# Patient Record
Sex: Female | Born: 1985 | Race: White | Hispanic: No | Marital: Married | Smoking: Never smoker
Health system: Southern US, Community
[De-identification: ages and names within clinical notes are randomized; demographics above are authoritative.]

## PROBLEM LIST (undated history)

## (undated) DIAGNOSIS — K219 Gastro-esophageal reflux disease without esophagitis: Secondary | ICD-10-CM

## (undated) DIAGNOSIS — T7840XA Allergy, unspecified, initial encounter: Secondary | ICD-10-CM

## (undated) DIAGNOSIS — F909 Attention-deficit hyperactivity disorder, unspecified type: Secondary | ICD-10-CM

## (undated) DIAGNOSIS — J45909 Unspecified asthma, uncomplicated: Secondary | ICD-10-CM

## (undated) DIAGNOSIS — D509 Iron deficiency anemia, unspecified: Secondary | ICD-10-CM

## (undated) HISTORY — DX: Allergy, unspecified, initial encounter: T78.40XA

## (undated) HISTORY — PX: TONSILLECTOMY: SUR1361

## (undated) HISTORY — DX: Attention-deficit hyperactivity disorder, unspecified type: F90.9

## (undated) HISTORY — PX: TUBAL LIGATION: SHX77

## (undated) HISTORY — DX: Gastro-esophageal reflux disease without esophagitis: K21.9

## (undated) HISTORY — DX: Unspecified asthma, uncomplicated: J45.909

## (undated) HISTORY — DX: Iron deficiency anemia, unspecified: D50.9

---

## 2015-08-26 DIAGNOSIS — O10019 Pre-existing essential hypertension complicating pregnancy, unspecified trimester: Secondary | ICD-10-CM | POA: Insufficient documentation

## 2015-08-26 DIAGNOSIS — O10919 Unspecified pre-existing hypertension complicating pregnancy, unspecified trimester: Secondary | ICD-10-CM | POA: Insufficient documentation

## 2015-08-26 HISTORY — DX: Pre-existing essential hypertension complicating pregnancy, unspecified trimester: O10.019

## 2015-09-29 DIAGNOSIS — O34219 Maternal care for unspecified type scar from previous cesarean delivery: Secondary | ICD-10-CM | POA: Insufficient documentation

## 2015-09-29 DIAGNOSIS — Z98891 History of uterine scar from previous surgery: Secondary | ICD-10-CM | POA: Insufficient documentation

## 2017-11-15 DIAGNOSIS — E611 Iron deficiency: Secondary | ICD-10-CM | POA: Insufficient documentation

## 2018-05-15 DIAGNOSIS — K219 Gastro-esophageal reflux disease without esophagitis: Secondary | ICD-10-CM | POA: Insufficient documentation

## 2018-07-09 DIAGNOSIS — R1013 Epigastric pain: Secondary | ICD-10-CM | POA: Insufficient documentation

## 2019-04-17 DIAGNOSIS — N912 Amenorrhea, unspecified: Secondary | ICD-10-CM | POA: Diagnosis not present

## 2019-04-28 DIAGNOSIS — N390 Urinary tract infection, site not specified: Secondary | ICD-10-CM | POA: Diagnosis not present

## 2019-04-28 DIAGNOSIS — Z113 Encounter for screening for infections with a predominantly sexual mode of transmission: Secondary | ICD-10-CM | POA: Diagnosis not present

## 2019-04-28 DIAGNOSIS — Z01419 Encounter for gynecological examination (general) (routine) without abnormal findings: Secondary | ICD-10-CM | POA: Diagnosis not present

## 2019-04-28 DIAGNOSIS — Z114 Encounter for screening for human immunodeficiency virus [HIV]: Secondary | ICD-10-CM | POA: Diagnosis not present

## 2019-04-28 DIAGNOSIS — N912 Amenorrhea, unspecified: Secondary | ICD-10-CM | POA: Diagnosis not present

## 2019-04-28 DIAGNOSIS — Z3481 Encounter for supervision of other normal pregnancy, first trimester: Secondary | ICD-10-CM | POA: Diagnosis not present

## 2019-05-09 DIAGNOSIS — Z641 Problems related to multiparity: Secondary | ICD-10-CM | POA: Diagnosis not present

## 2019-05-09 DIAGNOSIS — Z3A13 13 weeks gestation of pregnancy: Secondary | ICD-10-CM | POA: Diagnosis not present

## 2019-05-09 DIAGNOSIS — Z3682 Encounter for antenatal screening for nuchal translucency: Secondary | ICD-10-CM | POA: Diagnosis not present

## 2019-05-09 DIAGNOSIS — Z3401 Encounter for supervision of normal first pregnancy, first trimester: Secondary | ICD-10-CM | POA: Diagnosis not present

## 2019-05-09 DIAGNOSIS — Z3491 Encounter for supervision of normal pregnancy, unspecified, first trimester: Secondary | ICD-10-CM | POA: Diagnosis not present

## 2019-05-09 DIAGNOSIS — Z36 Encounter for antenatal screening for chromosomal anomalies: Secondary | ICD-10-CM | POA: Diagnosis not present

## 2019-05-09 DIAGNOSIS — Z3689 Encounter for other specified antenatal screening: Secondary | ICD-10-CM | POA: Diagnosis not present

## 2019-06-04 DIAGNOSIS — Z3A17 17 weeks gestation of pregnancy: Secondary | ICD-10-CM | POA: Diagnosis not present

## 2019-06-04 DIAGNOSIS — Z3482 Encounter for supervision of other normal pregnancy, second trimester: Secondary | ICD-10-CM | POA: Diagnosis not present

## 2019-06-30 DIAGNOSIS — Z98891 History of uterine scar from previous surgery: Secondary | ICD-10-CM | POA: Diagnosis not present

## 2019-06-30 DIAGNOSIS — O34219 Maternal care for unspecified type scar from previous cesarean delivery: Secondary | ICD-10-CM | POA: Diagnosis not present

## 2019-06-30 DIAGNOSIS — Z3A20 20 weeks gestation of pregnancy: Secondary | ICD-10-CM | POA: Diagnosis not present

## 2019-06-30 DIAGNOSIS — Z3689 Encounter for other specified antenatal screening: Secondary | ICD-10-CM | POA: Diagnosis not present

## 2019-08-13 DIAGNOSIS — Z3A27 27 weeks gestation of pregnancy: Secondary | ICD-10-CM | POA: Diagnosis not present

## 2019-08-13 DIAGNOSIS — Z3482 Encounter for supervision of other normal pregnancy, second trimester: Secondary | ICD-10-CM | POA: Diagnosis not present

## 2019-09-12 DIAGNOSIS — K219 Gastro-esophageal reflux disease without esophagitis: Secondary | ICD-10-CM | POA: Diagnosis not present

## 2019-09-22 DIAGNOSIS — O34219 Maternal care for unspecified type scar from previous cesarean delivery: Secondary | ICD-10-CM | POA: Diagnosis not present

## 2019-09-22 DIAGNOSIS — Z98891 History of uterine scar from previous surgery: Secondary | ICD-10-CM | POA: Diagnosis not present

## 2019-09-22 DIAGNOSIS — Z8759 Personal history of other complications of pregnancy, childbirth and the puerperium: Secondary | ICD-10-CM | POA: Diagnosis not present

## 2019-09-22 DIAGNOSIS — Z3A32 32 weeks gestation of pregnancy: Secondary | ICD-10-CM | POA: Diagnosis not present

## 2019-10-17 DIAGNOSIS — Z23 Encounter for immunization: Secondary | ICD-10-CM | POA: Diagnosis not present

## 2019-10-17 DIAGNOSIS — Z3A36 36 weeks gestation of pregnancy: Secondary | ICD-10-CM | POA: Diagnosis not present

## 2019-10-17 DIAGNOSIS — Z3483 Encounter for supervision of other normal pregnancy, third trimester: Secondary | ICD-10-CM | POA: Diagnosis not present

## 2019-10-20 DIAGNOSIS — Z8759 Personal history of other complications of pregnancy, childbirth and the puerperium: Secondary | ICD-10-CM | POA: Insufficient documentation

## 2019-10-31 DIAGNOSIS — Z01818 Encounter for other preprocedural examination: Secondary | ICD-10-CM | POA: Diagnosis not present

## 2019-11-04 DIAGNOSIS — Z302 Encounter for sterilization: Secondary | ICD-10-CM | POA: Diagnosis not present

## 2019-11-04 DIAGNOSIS — K219 Gastro-esophageal reflux disease without esophagitis: Secondary | ICD-10-CM | POA: Diagnosis not present

## 2019-11-04 DIAGNOSIS — O99214 Obesity complicating childbirth: Secondary | ICD-10-CM | POA: Diagnosis not present

## 2019-11-04 DIAGNOSIS — O9962 Diseases of the digestive system complicating childbirth: Secondary | ICD-10-CM | POA: Diagnosis not present

## 2019-11-04 DIAGNOSIS — O34211 Maternal care for low transverse scar from previous cesarean delivery: Secondary | ICD-10-CM | POA: Diagnosis not present

## 2019-11-04 DIAGNOSIS — O9952 Diseases of the respiratory system complicating childbirth: Secondary | ICD-10-CM | POA: Diagnosis not present

## 2019-11-04 DIAGNOSIS — R634 Abnormal weight loss: Secondary | ICD-10-CM | POA: Diagnosis not present

## 2019-11-04 DIAGNOSIS — J45909 Unspecified asthma, uncomplicated: Secondary | ICD-10-CM | POA: Diagnosis not present

## 2019-11-04 DIAGNOSIS — E663 Overweight: Secondary | ICD-10-CM | POA: Diagnosis not present

## 2019-11-04 DIAGNOSIS — R001 Bradycardia, unspecified: Secondary | ICD-10-CM | POA: Diagnosis not present

## 2019-11-04 DIAGNOSIS — O99284 Endocrine, nutritional and metabolic diseases complicating childbirth: Secondary | ICD-10-CM | POA: Diagnosis not present

## 2019-11-04 DIAGNOSIS — O34219 Maternal care for unspecified type scar from previous cesarean delivery: Secondary | ICD-10-CM | POA: Diagnosis not present

## 2019-11-04 DIAGNOSIS — E611 Iron deficiency: Secondary | ICD-10-CM | POA: Diagnosis not present

## 2019-11-04 DIAGNOSIS — Z641 Problems related to multiparity: Secondary | ICD-10-CM | POA: Diagnosis not present

## 2019-11-04 DIAGNOSIS — Z3A39 39 weeks gestation of pregnancy: Secondary | ICD-10-CM | POA: Diagnosis not present

## 2019-11-19 DIAGNOSIS — Z00111 Health examination for newborn 8 to 28 days old: Secondary | ICD-10-CM | POA: Diagnosis not present

## 2019-11-19 DIAGNOSIS — Z13228 Encounter for screening for other metabolic disorders: Secondary | ICD-10-CM | POA: Diagnosis not present

## 2020-08-06 ENCOUNTER — Ambulatory Visit (INDEPENDENT_AMBULATORY_CARE_PROVIDER_SITE_OTHER): Payer: BC Managed Care – PPO | Admitting: Family

## 2020-08-06 ENCOUNTER — Other Ambulatory Visit: Payer: Self-pay

## 2020-08-06 ENCOUNTER — Encounter: Payer: Self-pay | Admitting: Family

## 2020-08-06 VITALS — BP 132/81 | HR 85 | Ht 63.78 in | Wt 131.2 lb

## 2020-08-06 DIAGNOSIS — F902 Attention-deficit hyperactivity disorder, combined type: Secondary | ICD-10-CM | POA: Diagnosis not present

## 2020-08-06 DIAGNOSIS — J45909 Unspecified asthma, uncomplicated: Secondary | ICD-10-CM | POA: Insufficient documentation

## 2020-08-06 DIAGNOSIS — K219 Gastro-esophageal reflux disease without esophagitis: Secondary | ICD-10-CM

## 2020-08-06 DIAGNOSIS — F909 Attention-deficit hyperactivity disorder, unspecified type: Secondary | ICD-10-CM | POA: Insufficient documentation

## 2020-08-06 DIAGNOSIS — O26849 Uterine size-date discrepancy, unspecified trimester: Secondary | ICD-10-CM | POA: Insufficient documentation

## 2020-08-06 DIAGNOSIS — Z7689 Persons encountering health services in other specified circumstances: Secondary | ICD-10-CM | POA: Diagnosis not present

## 2020-08-06 NOTE — Progress Notes (Signed)
Subjective:    Jocelyn Sawyer - 35 y.o. female MRN [id number]  Date of birth: [birth year]  HPI  Jocelyn Sawyer is to establish care. Patient has a PMH significant for essential hypertension in pregnancy, asthma, gastroesophageal reflux disease without esophagitis, attention deficit hyperactivity disorder, and iron deficiency.   Current issues and/or concerns: 1. ADHD FOLLOW UP: Reports she was diagnosed at a younger age. Reports she will begin several tasks and not complete any of them most of the time. States was consistently taking Adderall until her most recent pregnancy at which time she did not take the medication. States she is ready to resume taking medication.   2. GI REFERRAL: Reports chronic history of GERD. Seeing GI in the past. Reports she had a scope procedure in the past and it was determined that she had gastritis. Was taking Protonix without relief and discontinued. Started on Dexilant which did provide relief but no longer taking currently. Says she is not eating acid reflux triggering foods. Would like referral back to GI to see if there is something more that can be done to assist and if maybe she should be restarted on Dexilant.  ROS per HPI    Health Maintenance:  Health Maintenance Due  Topic Date Due  . Hepatitis C Screening  Never done  . HIV Screening  Never done  . PAP SMEAR-Modifier  Never done  . COVID-19 Vaccine (3 - Booster for Moderna series) 02/27/2020     Past Medical History: Patient Active Problem List   Diagnosis Date Noted  . ADHD (attention deficit hyperactivity disorder) 08/06/2020  . Asthma 08/06/2020  . Uterine size date discrepancy pregnancy 08/06/2020  . [redacted] weeks gestation of pregnancy 11/04/2019  . History of prior pregnancy with IUGR newborn 10/20/2019  . Epigastric pain 07/09/2018  . Gastro-esophageal reflux disease without esophagitis 05/15/2018  . Iron deficiency 11/15/2017  . History of cesarean delivery affecting pregnancy  09/29/2015  . History of uterine scar from previous surgery 09/29/2015  . Indication for care in labor and delivery, antepartum 09/29/2015  . Essential hypertension in pregnancy 08/26/2015    Social History   reports that she has never smoked. She has never used smokeless tobacco. She reports previous alcohol use. She reports that she does not use drugs.   Family History  family history includes Cancer in her father; Hypertension in her mother.   Medications: reviewed and updated   Objective:   Physical Exam BP 132/81 (BP Location: Left Arm, Patient Position: Sitting)   Pulse 85   Ht 5' 3.78" (1.62 m)   Wt 131 lb 3.2 oz (59.5 kg)   SpO2 95%   BMI 22.68 kg/m     Physical Exam Constitutional:      Appearance: She is normal weight.  HENT:     Head: Normocephalic.  Eyes:     Extraocular Movements: Extraocular movements intact.     Pupils: Pupils are equal, round, and reactive to light.  Cardiovascular:     Rate and Rhythm: Normal rate and regular rhythm.     Pulses: Normal pulses.     Heart sounds: Normal heart sounds.  Pulmonary:     Effort: Pulmonary effort is normal.     Breath sounds: Normal breath sounds.  Musculoskeletal:     Cervical back: Normal range of motion and neck supple.  Neurological:     General: No focal deficit present.     Mental Status: She is alert and oriented to person, place, and time.  Psychiatric:  Mood and Affect: Mood normal.        Behavior: Behavior normal.       Assessment & Plan:  1. Encounter to establish care: - Patient presents today to establish care.  - Return for annual physical examination, labs, and health maintenance. Arrive fasting meaning having had no food and/or nothing to drink for at least 8 hours prior to appointment.  2. Attention deficit hyperactivity disorder (ADHD), combined type: - Reports she was diagnosed at a younger age. Reports she will begin several tasks and not complete any of them most of the  time. States was consistently taking Adderall until her most recent pregnancy at which time she did not take the medication. States she is ready to resume taking medication.  - Referral to Psychiatry for further evaluation and management.  - Ambulatory referral to Psychiatry  3. Gastro-esophageal reflux disease without esophagitis: - Reports chronic history of GERD. Seeing GI in the past. Reports she had a scope procedure in the past and it was determined that she had gastritis. Was taking Protonix without relief and discontinued. Started on Dexilant which did provide relief but no longer taking currently. Says she is not eating acid reflux triggering foods. Would like referral back to GI to see if there is something more that can be done to assist and if maybe she should be restarted on Dexilant. - Per patient request referral to Gastroenterology for further evaluation and management.  - Ambulatory referral to Gastroenterology   Ricky Stabs, NP 08/08/2020, 9:15 AM Primary Care at [facility]

## 2020-08-06 NOTE — Progress Notes (Signed)
Establish care Adderrall restart, stopped during pregnancy delivered in May 2021 Refer to GI

## 2020-08-06 NOTE — Patient Instructions (Signed)
Return for annual physical examination, labs, and health maintenance. Arrive fasting meaning having had no food and/or nothing to drink for at least 8 hours prior to appointment.  Referral to Psychiatry for ADD.   Referral to GI for acid reflux. Thank you for choosing Primary Care at [facility] for your medical home!    Jocelyn Sawyer was seen by Rema Fendt, NP today.   Jocelyn Sawyer's primary care provider is Nadalee Neiswender Jodi Geralds, NP .   For the best care possible,  you should try to see Ricky Stabs, NP whenever you come to clinic.   We look forward to seeing you again soon!  If you have any questions about your visit today,  please call us at [phone]  Or feel free to reach your provider via MyChart.    Gastroesophageal Reflux Disease, Adult  Gastroesophageal reflux (GER) happens when acid from the stomach flows up into the tube that connects the mouth and the stomach (esophagus). Normally, food travels down the esophagus and stays in the stomach to be digested. With GER, food and stomach acid sometimes move back up into the esophagus. You may have a disease called gastroesophageal reflux disease (GERD) if the reflux:  Happens often.  Causes frequent or very bad symptoms.  Causes problems such as damage to the esophagus. When this happens, the esophagus becomes sore and swollen. Over time, GERD can make small holes (ulcers) in the lining of the esophagus. What are the causes? This condition is caused by a problem with the muscle between the esophagus and the stomach. When this muscle is weak or not normal, it does not close properly to keep food and acid from coming back up from the stomach. The muscle can be weak because of:  Tobacco use.  Pregnancy.  Having a certain type of hernia (hiatal hernia).  Alcohol use.  Certain foods and drinks, such as coffee, chocolate, onions, and peppermint. What increases the risk?  Being overweight.  Having a disease  that affects your connective tissue.  Taking NSAIDs, such a ibuprofen. What are the signs or symptoms?  Heartburn.  Difficult or painful swallowing.  The feeling of having a lump in the throat.  A bitter taste in the mouth.  Bad breath.  Having a lot of saliva.  Having an upset or bloated stomach.  Burping.  Chest pain. Different conditions can cause chest pain. Make sure you see your doctor if you have chest pain.  Shortness of breath or wheezing.  A long-term cough or a cough at night.  Wearing away of the surface of teeth (tooth enamel).  Weight loss. How is this treated?  Making changes to your diet.  Taking medicine.  Having surgery. Treatment will depend on how bad your symptoms are. Follow these instructions at home: Eating and drinking  Follow a diet as told by your doctor. You may need to avoid foods and drinks such as: ? Coffee and tea, with or without caffeine. ? Drinks that contain alcohol. ? Energy drinks and sports drinks. ? Bubbly (carbonated) drinks or sodas. ? Chocolate and cocoa. ? Peppermint and mint flavorings. ? Garlic and onions. ? Horseradish. ? Spicy and acidic foods. These include peppers, chili powder, curry powder, vinegar, hot sauces, and BBQ sauce. ? Citrus fruit juices and citrus fruits, such as oranges, lemons, and limes. ? Tomato-based foods. These include red sauce, chili, salsa, and pizza with red sauce. ? Fried and fatty foods. These include donuts, french fries, potato  chips, and high-fat dressings. ? High-fat meats. These include hot dogs, rib eye steak, sausage, ham, and bacon. ? High-fat dairy items, such as whole milk, butter, and cream cheese.  Eat small meals often. Avoid eating large meals.  Avoid drinking large amounts of liquid with your meals.  Avoid eating meals during the 2-3 hours before bedtime.  Avoid lying down right after you eat.  Do not exercise right after you eat.   Lifestyle  Do not smoke or  use any products that contain nicotine or tobacco. If you need help quitting, ask your doctor.  Try to lower your stress. If you need help doing this, ask your doctor.  If you are overweight, lose an amount of weight that is healthy for you. Ask your doctor about a safe weight loss goal.   General instructions  Pay attention to any changes in your symptoms.  Take over-the-counter and prescription medicines only as told by your doctor.  Do not take aspirin, ibuprofen, or other NSAIDs unless your doctor says it is okay.  Wear loose clothes. Do not wear anything tight around your waist.  Raise (elevate) the head of your bed about 6 inches (15 cm). You may need to use a wedge to do this.  Avoid bending over if this makes your symptoms worse.  Keep all follow-up visits. Contact a doctor if:  You have new symptoms.  You lose weight and you do not know why.  You have trouble swallowing or it hurts to swallow.  You have wheezing or a cough that keeps happening.  You have a hoarse voice.  Your symptoms do not get better with treatment. Get help right away if:  You have sudden pain in your arms, neck, jaw, teeth, or back.  You suddenly feel sweaty, dizzy, or light-headed.  You have chest pain or shortness of breath.  You vomit and the vomit is green, yellow, or black, or it looks like blood or coffee grounds.  You faint.  Your poop (stool) is red, bloody, or black.  You cannot swallow, drink, or eat. These symptoms may represent a serious problem that is an emergency. Do not wait to see if the symptoms will go away. Get medical help right away. Call your local emergency services (911 in the [place] Do not drive yourself to the hospital. Summary  If a person has gastroesophageal reflux disease (GERD), food and stomach acid move back up into the esophagus and cause symptoms or problems such as damage to the esophagus.  Treatment will depend on how bad your symptoms  are.  Follow a diet as told by your doctor.  Take all medicines only as told by your doctor. This information is not intended to replace advice given to you by your health care provider. Make sure you discuss any questions you have with your health care provider. Document Revised: 12/29/2019 Document Reviewed: 12/29/2019 Elsevier Patient Education  2021 [employer].

## 2020-08-18 ENCOUNTER — Ambulatory Visit (INDEPENDENT_AMBULATORY_CARE_PROVIDER_SITE_OTHER): Payer: BC Managed Care – PPO | Admitting: Internal Medicine

## 2020-08-18 ENCOUNTER — Other Ambulatory Visit: Payer: Self-pay

## 2020-08-18 ENCOUNTER — Encounter: Payer: Self-pay | Admitting: Gastroenterology

## 2020-08-18 ENCOUNTER — Encounter: Payer: Self-pay | Admitting: Family

## 2020-08-18 VITALS — BP 124/78 | HR 68 | Ht 63.0 in | Wt 133.2 lb

## 2020-08-18 DIAGNOSIS — K219 Gastro-esophageal reflux disease without esophagitis: Secondary | ICD-10-CM | POA: Diagnosis not present

## 2020-08-18 MED ORDER — DEXLANSOPRAZOLE 60 MG PO CPDR: 60.0000 mg | DELAYED_RELEASE_CAPSULE | Freq: Every day | ORAL | 3 refills | Status: DC

## 2020-08-18 NOTE — Patient Instructions (Addendum)
We have sent the following medications to your pharmacy for you to pick up at your convenience:  Dexilant.  I will initiate a prior authorization as well.  Please follow up in one year

## 2020-08-18 NOTE — Progress Notes (Signed)
HISTORY OF PRESENT ILLNESS:  Jocelyn Sawyer is a 35 y.o. female, mother of 3 recently relocated to [place] with her husband for his job with [employer], with chronic GERD who presents today to establish care.  Also request medication refill.  Patient reports problems with classic reflux symptoms since college.  Initially treated with over-the-counter H2 receptor antagonist therapy and PPIs.  This helped.  Symptoms worsened over time.  Seen by GI physician in [facility] area November 2019.  Symptoms refractory to omeprazole and pantoprazole.  Underwent upper endoscopy (reviewed) January 2020.  Esophagus was normal.  Small lateral hernia.  Mild nonspecific antral erythema.  Otherwise normal.  She was placed on Dexilant 60 mg daily.  This results in excellent control of reflux symptoms.  Off PPI symptoms will recur within a few days to a week at which time she has severe epigastric burning, decreased appetite, and intolerance to foods.  On medication she does well with minimal food intolerance.  She denies dysphagia.  She has completed her COVID vaccination series and booster.  REVIEW OF SYSTEMS:  All non-GI ROS negative entirely  Past Medical History:  Diagnosis Date  . ADHD (attention deficit hyperactivity disorder)   . Asthma   . GERD (gastroesophageal reflux disease)   . IDA (iron deficiency anemia)     Past Surgical History:  Procedure Laterality Date  . CESAREAN SECTION     x 3  . TONSILLECTOMY    . TUBAL LIGATION      Social History Jocelyn Sawyer  reports that she has never smoked. She has never used smokeless tobacco. She reports previous alcohol use. She reports that she does not use drugs.  family history includes Bipolar disorder in her mother; Bone cancer in her paternal grandmother; Breast cancer in her maternal grandmother; Heart attack in her maternal grandfather; Hypertension in her mother; Kidney disease in her mother; Leukemia in her maternal grandmother;  Suicidality in her paternal grandfather; Throat cancer in her father.  Allergies  Allergen Reactions  . Nickel Swelling  . Sulfa Antibiotics Hives       PHYSICAL EXAMINATION: Vital signs: BP 124/78 (BP Location: Left Arm, Patient Position: Sitting, Cuff Size: Normal)   Pulse 68   Ht 5\' 3"  (1.6 m) Comment: height measured without shoes  Wt 133 lb 4 oz (60.4 kg)   LMP 08/17/2020   BMI 23.60 kg/m   Constitutional: generally well-appearing, no acute distress Psychiatric: alert and oriented x3, cooperative Eyes: extraocular movements intact, anicteric, conjunctiva pink Mouth: oral pharynx moist, no lesions Neck: supple no lymphadenopathy Cardiovascular: heart regular rate and rhythm, no murmur Lungs: clear to auscultation bilaterally Abdomen: soft, nontender, nondistended, no obvious ascites, no peritoneal signs, normal bowel sounds, no organomegaly Rectal: Omitted Extremities: no clubbing, cyanosis, or lower extremity edema bilaterally Skin: no lesions on visible extremities Neuro: No focal deficits.  Cranial nerves intact  ASSESSMENT:  1.  Chronic GERD.  Normal EGD.  Requires PPI for control symptoms.  Has failed multiple PPIs except for Dexilant   PLAN:  1.  Detailed discussion of the pathophysiology of GERD.  Discussed medical and nonmedical therapies. 2.  Prescribe Dexilant 60 mg daily.  Medication risks and the risk of of chronic PPI use reviewed in detail (based on current knowledge base). 3.  Reflux precautions 4.  Routine office follow-up in 1 year.  Contact the office in the interim for any questions or problems  Time of 45 minutes was required for.  To see the patient, reviewing  outside endoscopy reports about office notes, and tests.  Obtaining comprehensive history.  Performing medically appropriate physical examination.  Counseling the patient regarding her condition and its management.  Ordering medication and discussing its effects as discussed above.  Discussing  appropriate follow-up and its rationale.  Finally, documenting clinical information in the health record

## 2020-08-19 NOTE — Telephone Encounter (Signed)
[facility]  Is closed  because  Amy told the patient to contact them they do walk ins no need referral .   Patient will be contact    [address] [place], [place] [address] Ph # [phone] They do walk ins too

## 2020-08-24 ENCOUNTER — Telehealth: Payer: Self-pay | Admitting: Internal Medicine

## 2020-08-27 NOTE — Telephone Encounter (Signed)
Completed PA for Dexilant; awaiting response

## 2020-08-30 DIAGNOSIS — N3000 Acute cystitis without hematuria: Secondary | ICD-10-CM | POA: Diagnosis not present

## 2020-08-31 NOTE — Telephone Encounter (Signed)
Sent additional info per [facility]' request.

## 2020-09-02 ENCOUNTER — Telehealth: Payer: Self-pay | Admitting: Family

## 2020-09-02 ENCOUNTER — Other Ambulatory Visit: Payer: Self-pay

## 2020-09-02 ENCOUNTER — Ambulatory Visit: Payer: BC Managed Care – PPO | Admitting: Physician Assistant

## 2020-09-02 VITALS — BP 136/86 | HR 80 | Temp 98.2°F | Resp 18 | Ht 63.0 in | Wt 128.0 lb

## 2020-09-02 DIAGNOSIS — N3000 Acute cystitis without hematuria: Secondary | ICD-10-CM | POA: Diagnosis not present

## 2020-09-02 LAB — POCT URINALYSIS DIP (CLINITEK)
Bilirubin, UA: NEGATIVE
Blood, UA: NEGATIVE
Glucose, UA: NEGATIVE mg/dL
Ketones, POC UA: NEGATIVE mg/dL
Leukocytes, UA: NEGATIVE
Nitrite, UA: NEGATIVE
POC PROTEIN,UA: NEGATIVE
Spec Grav, UA: <=1.005 — AB (ref 1.010–1.025)
Urobilinogen, UA: 0.2 E.U./dL
pH, UA: 5.5 (ref 5.0–8.0)

## 2020-09-02 MED ORDER — CEFTRIAXONE SODIUM 1 G IJ SOLR
1.0000 g | Freq: Once | INTRAMUSCULAR | Status: AC
Start: 2020-09-02 — End: 2020-09-02
  Administered 2020-09-02: 1 g via INTRAMUSCULAR

## 2020-09-02 NOTE — Progress Notes (Signed)
Established Patient Office Visit  Subjective:  Patient ID: Jocelyn Sawyer, female    DOB: 26-Nov-1985  Age: 35 y.o. MRN: [id number]  CC:  Chief Complaint  Patient presents with  . Urinary Tract Infection    Back Pain    HPI Jocelyn Sawyer reports that she started having bilateral lower back pain, increased urgency and frequency and dysuria on Monday.  Reports chills, denies fever, nausea or vomiting.  Reports that she had a televisit through her husband's employer and was given a prescription for Freeman.  Reports that she has been taking it as directed but has not had any improvement, and feels that her symptoms have worsened.  Reports that the back pain woke her from her sleep last night.  Reports that she did use some Tylenol with some relief.    Past Medical History:  Diagnosis Date  . ADHD (attention deficit hyperactivity disorder)   . Asthma   . GERD (gastroesophageal reflux disease)   . IDA (iron deficiency anemia)     Past Surgical History:  Procedure Laterality Date  . CESAREAN SECTION     x 3  . TONSILLECTOMY    . TUBAL LIGATION      Family History  Problem Relation Age of Onset  . Hypertension Mother   . Bipolar disorder Mother   . Kidney disease Mother   . Throat cancer Father        squamous cell  . Breast cancer Maternal Grandmother   . Leukemia Maternal Grandmother   . Heart attack Maternal Grandfather   . Bone cancer Paternal Grandmother   . Suicidality Paternal Grandfather     Social History   Socioeconomic History  . Marital status: Married    Spouse name: Not on file  . Number of children: 3  . Years of education: Not on file  . Highest education level: Not on file  Occupational History  . Occupation: Nurse  Tobacco Use  . Smoking status: Never Smoker  . Smokeless tobacco: Never Used  Vaping Use  . Vaping Use: Never used  Substance and Sexual Activity  . Alcohol use: Not Currently  . Drug use: Never  . Sexual activity: Yes     Birth control/protection: None  Other Topics Concern  . Not on file  Social History Narrative  . Not on file   Social Determinants of Health   Financial Resource Strain: Not on file  Food Insecurity: Not on file  Transportation Needs: Not on file  Physical Activity: Not on file  Stress: Not on file  Social Connections: Not on file  Intimate Partner Violence: Not on file    Outpatient Medications Prior to Visit  Medication Sig Dispense Refill  . albuterol (VENTOLIN HFA) 108 (90 Base) MCG/ACT inhaler Inhale into the lungs.    Marland Kitchen amphetamine-dextroamphetamine (ADDERALL XR) 10 MG 24 hr capsule Take by mouth.    . dexlansoprazole (DEXILANT) 60 MG capsule Take 1 capsule (60 mg total) by mouth daily. 90 capsule 3  . Probiotic Product (PROBIOTIC-10 PO) Take by mouth.    . dexlansoprazole (DEXILANT) 60 MG capsule Take 60 mg by mouth daily.     No facility-administered medications prior to visit.    Allergies  Allergen Reactions  . Nickel Swelling  . Sulfa Antibiotics Hives    ROS Review of Systems  Constitutional: Positive for chills. Negative for fever.  HENT: Negative.   Eyes: Negative.   Respiratory: Negative.   Cardiovascular: Negative.   Gastrointestinal: Positive for abdominal  pain. Negative for nausea and vomiting.  Endocrine: Negative.   Genitourinary: Positive for dysuria, frequency and urgency. Negative for hematuria and vaginal discharge.  Musculoskeletal: Positive for back pain.  Skin: Negative.   Allergic/Immunologic: Negative.   Neurological: Negative.   Hematological: Negative.   Psychiatric/Behavioral: Negative.       Objective:    Physical Exam Vitals and nursing note reviewed.  Constitutional:      General: She is not in acute distress.    Appearance: Normal appearance. She is not ill-appearing.  HENT:     Head: Normocephalic and atraumatic.     Right Ear: External ear normal.     Left Ear: External ear normal.     Nose: Nose normal.      Mouth/Throat:     Mouth: Mucous membranes are moist.     Pharynx: Oropharynx is clear.  Eyes:     Extraocular Movements: Extraocular movements intact.     Conjunctiva/sclera: Conjunctivae normal.     Pupils: Pupils are equal, round, and reactive to light.  Cardiovascular:     Rate and Rhythm: Normal rate and regular rhythm.     Pulses: Normal pulses.     Heart sounds: Normal heart sounds.  Pulmonary:     Effort: Pulmonary effort is normal.     Breath sounds: Normal breath sounds.  Abdominal:     Tenderness: There is abdominal tenderness in the suprapubic area. There is right CVA tenderness and left CVA tenderness.  Musculoskeletal:        General: Normal range of motion.     Cervical back: Normal range of motion and neck supple.  Skin:    General: Skin is warm and dry.  Neurological:     General: No focal deficit present.     Mental Status: She is alert and oriented to person, place, and time.  Psychiatric:        Mood and Affect: Mood normal.        Behavior: Behavior normal.        Thought Content: Thought content normal.        Judgment: Judgment normal.     BP 136/86 (BP Location: Left Arm, Patient Position: Sitting, Cuff Size: Normal)   Pulse 80   Temp 98.2 F (36.8 C) (Oral)   Resp 18   Ht _0  (1.6 m)   Wt 128 lb (58.1 kg)   LMP 08/17/2020   SpO2 99%   BMI 22.67 kg/m  Wt Readings from Last 3 Encounters:  09/02/20 128 lb (58.1 kg)  08/18/20 133 lb 4 oz (60.4 kg)  08/06/20 131 lb 3.2 oz (59.5 kg)     Health Maintenance Due  Topic Date Due  . Hepatitis C Screening  Never done  . HIV Screening  Never done  . PAP SMEAR-Modifier  Never done    There are no preventive care reminders to display for this patient.  No results found for: TSH No results found for: WBC, HGB, HCT, MCV, PLT No results found for: NA, K, CHLORIDE, CO2, GLUCOSE, BUN, CREATININE, BILITOT, ALKPHOS, AST, ALT, PROT, ALBUMIN, CALCIUM, ANIONGAP, EGFR, GFR No results found for: CHOL No  results found for: HDL No results found for: LDLCALC No results found for: TRIG No results found for: CHOLHDL No results found for: HGBA1C    Assessment & Plan:   Problem List Items Addressed This Visit   None   Visit Diagnoses    Acute cystitis without hematuria    -  Primary  Relevant Medications   cefTRIAXone (ROCEPHIN) injection 1 g (Completed)   Other Relevant Orders   POCT URINALYSIS DIP (CLINITEK) (Completed)    1. Acute cystitis without hematuria Continue current regimen, red flags given for prompt reevaluation - POCT URINALYSIS DIP (CLINITEK) - cefTRIAXone (ROCEPHIN) injection 1 g   Meds ordered this encounter  Medications  . cefTRIAXone (ROCEPHIN) injection 1 g    I have reviewed the patient's medical history (PMH, PSH, Social History, Family History, Medications, and allergies) , and have been updated if relevant. I spent 20 minutes reviewing chart and  face to face time with patient.    Follow-up: Return if symptoms worsen or fail to improve.    Loraine Grip Mayers, PA-C

## 2020-09-02 NOTE — Patient Instructions (Signed)
Continue to finish your prescription for Macrobid.  Keep up the great work with the hydration.  I hope that you feel better soon, please let us know there is anything else that we can do for you  Roney Jaffe, PA-C Physician Assistant [facility] [email or web address]    Urinary Tract Infection, Adult  A urinary tract infection (UTI) is an infection of any part of the urinary tract. The urinary tract includes the kidneys, ureters, bladder, and urethra. These organs make, store, and get rid of urine in the body. An upper UTI affects the ureters and kidneys. A lower UTI affects the bladder and urethra. What are the causes? Most urinary tract infections are caused by bacteria in your genital area around your urethra, where urine leaves your body. These bacteria grow and cause inflammation of your urinary tract. What increases the risk? You are more likely to develop this condition if:  You have a urinary catheter that stays in place.  You are not able to control when you urinate or have a bowel movement (incontinence).  You are female and you: ? Use a spermicide or diaphragm for birth control. ? Have low estrogen levels. ? Are pregnant.  You have certain genes that increase your risk.  You are sexually active.  You take antibiotic medicines.  You have a condition that causes your flow of urine to slow down, such as: ? An enlarged prostate, if you are female. ? Blockage in your urethra. ? A kidney stone. ? A nerve condition that affects your bladder control (neurogenic bladder). ? Not getting enough to drink, or not urinating often.  You have certain medical conditions, such as: ? Diabetes. ? A weak disease-fighting system (immunesystem). ? Sickle cell disease. ? Gout. ? Spinal cord injury. What are the signs or symptoms? Symptoms of this condition include:  Needing to urinate right away (urgency).  Frequent  urination. This may include small amounts of urine each time you urinate.  Pain or burning with urination.  Blood in the urine.  Urine that smells bad or unusual.  Trouble urinating.  Cloudy urine.  Vaginal discharge, if you are female.  Pain in the abdomen or the lower back. You may also have:  Vomiting or a decreased appetite.  Confusion.  Irritability or tiredness.  A fever or chills.  Diarrhea. The first symptom in older adults may be confusion. In some cases, they may not have any symptoms until the infection has worsened. How is this diagnosed? This condition is diagnosed based on your medical history and a physical exam. You may also have other tests, including:  Urine tests.  Blood tests.  Tests for STIs (sexually transmitted infections). If you have had more than one UTI, a cystoscopy or imaging studies may be done to determine the cause of the infections. How is this treated? Treatment for this condition includes:  Antibiotic medicine.  Over-the-counter medicines to treat discomfort.  Drinking enough water to stay hydrated. If you have frequent infections or have other conditions such as a kidney stone, you may need to see a health care provider who specializes in the urinary tract (urologist). In rare cases, urinary tract infections can cause sepsis. Sepsis is a life-threatening condition that occurs when the body responds to an infection. Sepsis is treated in the hospital with IV antibiotics, fluids, and other medicines. Follow these instructions at home: Medicines  Take over-the-counter and prescription medicines only as told by your health care provider.  If you were  prescribed an antibiotic medicine, take it as told by your health care provider. Do not stop using the antibiotic even if you start to feel better. General instructions  Make sure you: ? Empty your bladder often and completely. Do not hold urine for long periods of time. ? Empty your  bladder after sex. ? Wipe from front to back after urinating or having a bowel movement if you are female. Use each tissue only one time when you wipe.  Drink enough fluid to keep your urine pale yellow.  Keep all follow-up visits. This is important.   Contact a health care provider if:  Your symptoms do not get better after 1-2 days.  Your symptoms go away and then return. Get help right away if:  You have severe pain in your back or your lower abdomen.  You have a fever or chills.  You have nausea or vomiting. Summary  A urinary tract infection (UTI) is an infection of any part of the urinary tract, which includes the kidneys, ureters, bladder, and urethra.  Most urinary tract infections are caused by bacteria in your genital area.  Treatment for this condition often includes antibiotic medicines.  If you were prescribed an antibiotic medicine, take it as told by your health care provider. Do not stop using the antibiotic even if you start to feel better.  Keep all follow-up visits. This is important. This information is not intended to replace advice given to you by your health care provider. Make sure you discuss any questions you have with your health care provider. Document Revised: 01/30/2020 Document Reviewed: 01/30/2020 Elsevier Patient Education  2021 [employer].

## 2020-09-02 NOTE — Progress Notes (Signed)
Patient reports dysuria ocuring Monday along with lower back pain. Pain began in the middle of the night on the left side and is now bilateral. Patient began UTI treatment Monday but reports back pain is still present. Patient denies any STI concerns.

## 2020-09-03 DIAGNOSIS — N3000 Acute cystitis without hematuria: Secondary | ICD-10-CM | POA: Insufficient documentation

## 2020-09-10 ENCOUNTER — Encounter: Payer: Self-pay | Admitting: Family

## 2020-09-10 NOTE — Telephone Encounter (Signed)
Dexilant PA not approved.  Sent patient mychart message asking if there was any other PPI I could try to send in for her.

## 2020-09-15 ENCOUNTER — Telehealth: Payer: Self-pay

## 2020-09-15 NOTE — Telephone Encounter (Signed)
Faxed appeal for Dexilant

## 2020-09-17 ENCOUNTER — Other Ambulatory Visit: Payer: Self-pay

## 2020-09-17 ENCOUNTER — Encounter: Payer: Self-pay | Admitting: Family

## 2020-09-17 ENCOUNTER — Ambulatory Visit (INDEPENDENT_AMBULATORY_CARE_PROVIDER_SITE_OTHER): Payer: BC Managed Care – PPO | Admitting: Family

## 2020-09-17 VITALS — BP 123/79 | HR 65 | Ht 63.78 in | Wt 128.2 lb

## 2020-09-17 DIAGNOSIS — R5383 Other fatigue: Secondary | ICD-10-CM

## 2020-09-17 DIAGNOSIS — F419 Anxiety disorder, unspecified: Secondary | ICD-10-CM

## 2020-09-17 DIAGNOSIS — Z13228 Encounter for screening for other metabolic disorders: Secondary | ICD-10-CM

## 2020-09-17 DIAGNOSIS — Z0000 Encounter for general adult medical examination without abnormal findings: Secondary | ICD-10-CM

## 2020-09-17 DIAGNOSIS — Z1329 Encounter for screening for other suspected endocrine disorder: Secondary | ICD-10-CM

## 2020-09-17 DIAGNOSIS — Z1321 Encounter for screening for nutritional disorder: Secondary | ICD-10-CM

## 2020-09-17 DIAGNOSIS — Z1159 Encounter for screening for other viral diseases: Secondary | ICD-10-CM | POA: Diagnosis not present

## 2020-09-17 DIAGNOSIS — Z13220 Encounter for screening for lipoid disorders: Secondary | ICD-10-CM

## 2020-09-17 DIAGNOSIS — Z130 Encounter for screening for diseases of the blood and blood-forming organs and certain disorders involving the immune mechanism: Secondary | ICD-10-CM | POA: Diagnosis not present

## 2020-09-17 DIAGNOSIS — R61 Generalized hyperhidrosis: Secondary | ICD-10-CM | POA: Diagnosis not present

## 2020-09-17 DIAGNOSIS — Z114 Encounter for screening for human immunodeficiency virus [HIV]: Secondary | ICD-10-CM | POA: Diagnosis not present

## 2020-09-17 DIAGNOSIS — D509 Iron deficiency anemia, unspecified: Secondary | ICD-10-CM

## 2020-09-17 DIAGNOSIS — Z124 Encounter for screening for malignant neoplasm of cervix: Secondary | ICD-10-CM

## 2020-09-17 DIAGNOSIS — F902 Attention-deficit hyperactivity disorder, combined type: Secondary | ICD-10-CM

## 2020-09-17 DIAGNOSIS — Z131 Encounter for screening for diabetes mellitus: Secondary | ICD-10-CM | POA: Diagnosis not present

## 2020-09-17 DIAGNOSIS — F32A Depression, unspecified: Secondary | ICD-10-CM

## 2020-09-17 DIAGNOSIS — L659 Nonscarring hair loss, unspecified: Secondary | ICD-10-CM

## 2020-09-17 NOTE — Patient Instructions (Addendum)
 Annual physical exam and labs today.   Follow-up with primary provider as scheduled. Preventive Care 21-36 Years Old, Female Preventive care refers to lifestyle choices and visits with your health care provider that can promote health and wellness. This includes:  A yearly physical exam. This is also called an annual wellness visit.  Regular dental and eye exams.  Immunizations.  Screening for certain conditions.  Healthy lifestyle choices, such as: ? Eating a healthy diet. ? Getting regular exercise. ? Not using drugs or products that contain nicotine and tobacco. ? Limiting alcohol use. What can I expect for my preventive care visit? Physical exam Your health care provider may check your:  Height and weight. These may be used to calculate your BMI (body mass index). BMI is a measurement that tells if you are at a healthy weight.  Heart rate and blood pressure.  Body temperature.  Skin for abnormal spots. Counseling Your health care provider may ask you questions about your:  Past medical problems.  Family's medical history.  Alcohol, tobacco, and drug use.  Emotional well-being.  Home life and relationship well-being.  Sexual activity.  Diet, exercise, and sleep habits.  Work and work environment.  Access to firearms.  Method of birth control.  Menstrual cycle.  Pregnancy history. What immunizations do I need? Vaccines are usually given at various ages, according to a schedule. Your health care provider will recommend vaccines for you based on your age, medical history, and lifestyle or other factors, such as travel or where you work.   What tests do I need? Blood tests  Lipid and cholesterol levels. These may be checked every 5 years starting at age 20.  Hepatitis C test.  Hepatitis B test. Screening  Diabetes screening. This is done by checking your blood sugar (glucose) after you have not eaten for a while (fasting).  STD (sexually  transmitted disease) testing, if you are at risk.  BRCA-related cancer screening. This may be done if you have a family history of breast, ovarian, tubal, or peritoneal cancers.  Pelvic exam and Pap test. This may be done every 3 years starting at age 21. Starting at age 30, this may be done every 5 years if you have a Pap test in combination with an HPV test. Talk with your health care provider about your test results, treatment options, and if necessary, the need for more tests.   Follow these instructions at home: Eating and drinking  Eat a healthy diet that includes fresh fruits and vegetables, whole grains, lean protein, and low-fat dairy products.  Take vitamin and mineral supplements as recommended by your health care provider.  Do not drink alcohol if: ? Your health care provider tells you not to drink. ? You are pregnant, may be pregnant, or are planning to become pregnant.  If you drink alcohol: ? Limit how much you have to 0-1 drink a day. ? Be aware of how much alcohol is in your drink. In the [place] one drink equals one 12 oz bottle of beer (355 mL), one 5 oz glass of wine (148 mL), or one 1 oz glass of hard liquor (44 mL).   Lifestyle  Take daily care of your teeth and gums. Brush your teeth every morning and night with fluoride toothpaste. Floss one time each day.  Stay active. Exercise for at least 30 minutes 5 or more days each week.  Do not use any products that contain nicotine or tobacco, such as cigarettes, e-cigarettes, and   chewing tobacco. If you need help quitting, ask your health care provider.  Do not use drugs.  If you are sexually active, practice safe sex. Use a condom or other form of protection to prevent STIs (sexually transmitted infections).  If you do not wish to become pregnant, use a form of birth control. If you plan to become pregnant, see your health care provider for a prepregnancy visit.  Find healthy ways to cope with stress, such  as: ? Meditation, yoga, or listening to music. ? Journaling. ? Talking to a trusted person. ? Spending time with friends and family. Safety  Always wear your seat belt while driving or riding in a vehicle.  Do not drive: ? If you have been drinking alcohol. Do not ride with someone who has been drinking. ? When you are tired or distracted. ? While texting.  Wear a helmet and other protective equipment during sports activities.  If you have firearms in your house, make sure you follow all gun safety procedures.  Seek help if you have been physically or sexually abused. What's next?  Go to your health care provider once a year for an annual wellness visit.  Ask your health care provider how often you should have your eyes and teeth checked.  Stay up to date on all vaccines. This information is not intended to replace advice given to you by your health care provider. Make sure you discuss any questions you have with your health care provider. Document Revised: 02/15/2020 Document Reviewed: 02/28/2018 Elsevier Patient Education  2021 Elsevier Inc.  

## 2020-09-17 NOTE — Telephone Encounter (Signed)
PA appeal for Dexilant approved.  Let patient know through mychart.

## 2020-09-17 NOTE — Progress Notes (Signed)
Physical

## 2020-09-17 NOTE — Progress Notes (Signed)
Patient ID: Jocelyn Sawyer, female    DOB: [birth year]  MRN: [id number]  CC: Annual Physical Exam  Subjective: Jocelyn Sawyer is a 35 y.o. female who presents for annual physical exam.  Her concerns today include:   Reports feeling tired, fatigue, night sweats, hair loss, and feeling sad. Denies thoughts of self-harm, suicidal ideations, and homicidal ideations. She does have 4 children. The youngest child being 28 months old. She considered if she was experiencing post-partum depression several months ago. She then ruled that out because the symptoms were persisting and she has no previous history of postpartum depression. She is scheduled appointment with Psychiatry soon.  Depression screen [facility] 2/9 09/17/2020 09/02/2020 08/06/2020  Decreased Interest 1 1 0  Down, Depressed, Hopeless 2 0 0  PHQ - 2 Score 3 1 0  Altered sleeping 0 0 -  Tired, decreased energy 2 2 -  Change in appetite 0 0 -  Feeling bad or failure about yourself  2 0 -  Trouble concentrating 2 2 -  Moving slowly or fidgety/restless 0 0 -  Suicidal thoughts 0 0 -  PHQ-9 Score 9 5 -  Difficult doing work/chores Very difficult - -    Patient Active Problem List   Diagnosis Date Noted  . Iron deficiency anemia 09/18/2020  . Acute cystitis without hematuria 09/03/2020  . ADHD (attention deficit hyperactivity disorder) 08/06/2020  . Asthma 08/06/2020  . Uterine size date discrepancy pregnancy 08/06/2020  . [redacted] weeks gestation of pregnancy 11/04/2019  . History of prior pregnancy with IUGR newborn 10/20/2019  . Epigastric pain 07/09/2018  . Gastro-esophageal reflux disease without esophagitis 05/15/2018  . Iron deficiency 11/15/2017  . History of cesarean delivery affecting pregnancy 09/29/2015  . History of uterine scar from previous surgery 09/29/2015  . Indication for care in labor and delivery, antepartum 09/29/2015  . Essential hypertension in pregnancy 08/26/2015     Current Outpatient Medications on File  Prior to Visit  Medication Sig Dispense Refill  . albuterol (VENTOLIN HFA) 108 (90 Base) MCG/ACT inhaler Inhale into the lungs.    Marland Kitchen amphetamine-dextroamphetamine (ADDERALL XR) 10 MG 24 hr capsule Take by mouth.    . dexlansoprazole (DEXILANT) 60 MG capsule Take 1 capsule (60 mg total) by mouth daily. 90 capsule 3  . Probiotic Product (PROBIOTIC-10 PO) Take by mouth.     No current facility-administered medications on file prior to visit.    Allergies  Allergen Reactions  . Nickel Swelling  . Sulfa Antibiotics Hives    Social History   Socioeconomic History  . Marital status: Married    Spouse name: Not on file  . Number of children: 3  . Years of education: Not on file  . Highest education level: Not on file  Occupational History  . Occupation: Nurse  Tobacco Use  . Smoking status: Never Smoker  . Smokeless tobacco: Never Used  Vaping Use  . Vaping Use: Never used  Substance and Sexual Activity  . Alcohol use: Not Currently  . Drug use: Never  . Sexual activity: Yes    Birth control/protection: None  Other Topics Concern  . Not on file  Social History Narrative  . Not on file   Social Determinants of Health   Financial Resource Strain: Not on file  Food Insecurity: Not on file  Transportation Needs: Not on file  Physical Activity: Not on file  Stress: Not on file  Social Connections: Not on file  Intimate Partner Violence: Not on file  Family History  Problem Relation Age of Onset  . Hypertension Mother   . Bipolar disorder Mother   . Kidney disease Mother   . Throat cancer Father        squamous cell  . Breast cancer Maternal Grandmother   . Leukemia Maternal Grandmother   . Heart attack Maternal Grandfather   . Bone cancer Paternal Grandmother   . Suicidality Paternal Grandfather     Past Surgical History:  Procedure Laterality Date  . CESAREAN SECTION     x 3  . TONSILLECTOMY    . TUBAL LIGATION      ROS: Review of Systems Negative  except as stated above  PHYSICAL EXAM: BP 123/79 (BP Location: Left Arm, Patient Position: Sitting)   Pulse 65   Ht 5' 3.78" (1.62 m)   Wt 128 lb 3.2 oz (58.2 kg)   SpO2 98%   BMI 22.16 kg/m    Wt Readings from Last 3 Encounters:  09/17/20 128 lb 3.2 oz (58.2 kg)  09/02/20 128 lb (58.1 kg)  08/18/20 133 lb 4 oz (60.4 kg)    Physical Exam Exam conducted with a chaperone present.  Constitutional:      Appearance: She is normal weight.  HENT:     Head: Normocephalic and atraumatic.     Right Ear: Tympanic membrane, ear canal and external ear normal.     Left Ear: Tympanic membrane, ear canal and external ear normal.     Nose: Nose normal.     Mouth/Throat:     Mouth: Mucous membranes are moist.     Pharynx: Oropharynx is clear.  Eyes:     Extraocular Movements: Extraocular movements intact.     Conjunctiva/sclera: Conjunctivae normal.     Pupils: Pupils are equal, round, and reactive to light.  Cardiovascular:     Rate and Rhythm: Normal rate and regular rhythm.     Pulses: Normal pulses.     Heart sounds: Normal heart sounds.  Pulmonary:     Effort: Pulmonary effort is normal.     Breath sounds: Normal breath sounds.  Chest:  Breasts:     Right: Normal.     Left: Normal.      Comments: Margorie John, CMA present during examination.  Abdominal:     General: Abdomen is flat. Bowel sounds are normal.     Palpations: Abdomen is soft.  Genitourinary:    Comments: Patient declined examination. Musculoskeletal:        General: Normal range of motion.     Cervical back: Normal range of motion and neck supple.  Skin:    General: Skin is warm and dry.     Capillary Refill: Capillary refill takes less than 2 seconds.  Neurological:     General: No focal deficit present.     Mental Status: She is alert and oriented to person, place, and time.  Psychiatric:        Mood and Affect: Mood normal.        Behavior: Behavior normal.    ASSESSMENT AND PLAN: 1. Annual  physical exam: - Counseled on 150 minutes of exercise per week as tolerated, healthy eating (including decreased daily intake of saturated fats, cholesterol, added sugars, sodium), STI prevention, and routine healthcare maintenance.  2. Screening for metabolic disorder: - CMP to check kidney function, liver function, and electrolyte balance.  - Comprehensive metabolic panel  3. Screening for deficiency anemia: - CBC to screen for anemia. - Iron panel to screen for  anemia. - CBC - Iron, TIBC and Ferritin Panel  4. Diabetes mellitus screening: - Hemoglobin A1c to screen for pre-diabetes/diabetes. - Hemoglobin A1c  5. Screening cholesterol level: - Lipid panel to screen for high cholesterol.  - Lipid panel  6. Thyroid disorder screen: - TSH to check thyroid function.  - TSH+T4F+T3Free  7. Need for hepatitis C screening test: - HCV antibody to screen for hepatitis C.  - HCV Ab w/Rflx to Verification  8. Encounter for screening for HIV: - HIV antibody to screen for human immunodeficiency virus.  - HIV antibody (with reflex)  9. Cervical cancer screening: - Referral to Gynecology for cervical cancer screening by PAP smear. - Ambulatory referral to Gynecology  10. Encounter for vitamin deficiency screening: - Vitamin B12 and vitamin D to screen for vitamin deficiencies. - Vitamin B12 - Vitamin D, 25-hydroxy  11. Night sweats: 12. Fatigue, unspecified type: 13. Hair loss: - FSH to screen hormone levels. - TSH, CBC, CMP, hemoglobin A1c, vitamin D deficiency screen, and vitamin B12 to screen for fatigue.  - Follow-up with primary provider as needed for referral to Dermatology for hair loss.  - Follicle stimulating hormone  14. Attention deficit hyperactivity disorder (ADHD), combined type: - Keep appointment scheduled with Psychiatry.  15. Anxiety and depression: - Today patient's PHQ 2/9 screening consistent with anxiety and depression. - Stable.  - Denies thoughts of  self-harm, suicidal ideations, and homicidal ideations.  - Patient encouraged to discuss at appointment with Psychiatry.  - Follow-up with primary provider as needed.    Orders Placed This Encounter  Procedures  . HCV Ab w/Rflx to Verification  . HIV antibody (with reflex)  . CBC  . Comprehensive metabolic panel  . Lipid panel  . TSH+T4F+T3Free  . Hemoglobin A1c  . Follicle stimulating hormone  . Vitamin B12  . Vitamin D, 25-hydroxy  . Iron, TIBC and Ferritin Panel  . Interpretation:  . Ambulatory referral to Gynecology     Requested Prescriptions   Signed Prescriptions Disp Refills  . ferumoxytol in sodium chloride 0.9 % 100 mL 1 Dose 0    Sig: Ordered dose: 510 mg Route: intravenous Frequency: once @ 468 mL/hr over 15 minutes Dispense: 1 each Admin instructions: Monitor patients carefully for allergic reactions during Feraheme administration and for at least 30 minutes following each infusion. Allow minimum of 30 minutes between Feraheme and admin of meds that could potentially cause hypersensitivity reactions, such as chemotherapeutic agents or monoclonal antibodies. Note: After 3 to 8 days administer second dose.  . ferumoxytol in sodium chloride 0.9 % 100 mL 1 Dose 0    Sig: Ordered dose: 510 mg Route: intravenous Frequency: once @ 468 mL/hr over 15 minutes Dispense: 1 each Admin instructions: Monitor patients carefully for allergic reactions during Feraheme administration and for at least 30 minutes following each infusion. Allow minimum of 30 minutes between Feraheme and admin of meds that could potentially cause hypersensitivity reactions, such as chemotherapeutic agents or monoclonal antibodies. Note: After 3 to 8 days administer second dose.    Follow-up with primary provider as scheduled.   Rema Fendt, NP

## 2020-09-18 DIAGNOSIS — D509 Iron deficiency anemia, unspecified: Secondary | ICD-10-CM | POA: Insufficient documentation

## 2020-09-18 LAB — IRON,TIBC AND FERRITIN PANEL
Ferritin: 10 ng/mL — ABNORMAL LOW (ref 15–150)
Iron Saturation: 8 % — CL (ref 15–55)
Iron: 31 ug/dL (ref 27–159)
Total Iron Binding Capacity: 395 ug/dL (ref 250–450)
UIBC: 364 ug/dL (ref 131–425)

## 2020-09-18 LAB — COMPREHENSIVE METABOLIC PANEL
ALT: 14 IU/L (ref 0–32)
AST: 21 IU/L (ref 0–40)
Albumin/Globulin Ratio: 1.5 (ref 1.2–2.2)
Albumin: 4.4 g/dL (ref 3.8–4.8)
Alkaline Phosphatase: 49 IU/L (ref 44–121)
BUN/Creatinine Ratio: 20 (ref 9–23)
BUN: 21 mg/dL — ABNORMAL HIGH (ref 6–20)
Bilirubin Total: <0.2 mg/dL (ref 0.0–1.2)
CO2: 21 mmol/L (ref 20–29)
Calcium: 9.4 mg/dL (ref 8.7–10.2)
Chloride: 102 mmol/L (ref 96–106)
Creatinine, Ser: 1.05 mg/dL — ABNORMAL HIGH (ref 0.57–1.00)
Globulin, Total: 2.9 g/dL (ref 1.5–4.5)
Glucose: 87 mg/dL (ref 65–99)
Potassium: 4.1 mmol/L (ref 3.5–5.2)
Sodium: 138 mmol/L (ref 134–144)
Total Protein: 7.3 g/dL (ref 6.0–8.5)
eGFR: 71 mL/min/{1.73_m2} (ref >59)

## 2020-09-18 LAB — CBC
Hematocrit: 39.2 % (ref 34.0–46.6)
Hemoglobin: 12.5 g/dL (ref 11.1–15.9)
MCH: 25.9 pg — ABNORMAL LOW (ref 26.6–33.0)
MCHC: 31.9 g/dL (ref 31.5–35.7)
MCV: 81 fL (ref 79–97)
Platelets: 319 10*3/uL (ref 150–450)
RBC: 4.83 x10E6/uL (ref 3.77–5.28)
RDW: 14 % (ref 11.7–15.4)
WBC: 7.2 10*3/uL (ref 3.4–10.8)

## 2020-09-18 LAB — LIPID PANEL
Chol/HDL Ratio: 3.2 ratio (ref 0.0–4.4)
Cholesterol, Total: 178 mg/dL (ref 100–199)
HDL: 56 mg/dL (ref >39)
LDL Chol Calc (NIH): 105 mg/dL — ABNORMAL HIGH (ref 0–99)
Triglycerides: 96 mg/dL (ref 0–149)
VLDL Cholesterol Cal: 17 mg/dL (ref 5–40)

## 2020-09-18 LAB — VITAMIN D 25 HYDROXY (VIT D DEFICIENCY, FRACTURES): Vit D, 25-Hydroxy: 60.2 ng/mL (ref 30.0–100.0)

## 2020-09-18 LAB — VITAMIN B12: Vitamin B-12: 633 pg/mL (ref 232–1245)

## 2020-09-18 LAB — HCV INTERPRETATION

## 2020-09-18 LAB — TSH+T4F+T3FREE
Free T4: 1.28 ng/dL (ref 0.82–1.77)
T3, Free: 2.7 pg/mL (ref 2.0–4.4)
TSH: 1.8 u[IU]/mL (ref 0.450–4.500)

## 2020-09-18 LAB — FOLLICLE STIMULATING HORMONE: FSH: 4.6 m[IU]/mL

## 2020-09-18 LAB — HCV AB W/RFLX TO VERIFICATION: HCV Ab: <0.1 s/co ratio (ref 0.0–0.9)

## 2020-09-18 LAB — HIV ANTIBODY (ROUTINE TESTING W REFLEX): HIV Screen 4th Generation wRfx: NONREACTIVE

## 2020-09-18 LAB — HEMOGLOBIN A1C
Est. average glucose Bld gHb Est-mCnc: 120 mg/dL
Hgb A1c MFr Bld: 5.8 % — ABNORMAL HIGH (ref 4.8–5.6)

## 2020-09-18 MED ORDER — SODIUM CHLORIDE 0.9 % IV SOLN: INTRAVENOUS | 0 refills | Status: DC

## 2020-09-18 NOTE — Progress Notes (Signed)
Also, please plan to schedule an appointment to have your iron levels recheck at least 1 month after your second iron infusion.

## 2020-09-18 NOTE — Progress Notes (Signed)
Kidney function normal.   Liver function normal.   Thyroid normal.   Hormone  level normal.   Vitamin D normal.   Vitamin B12 normal.  Hepatitis C negative.  HIV negative.  LDL cholesterol, sometimes called "bad cholesterol", higher than expected. This may increase risk of heart attack and/or stroke. Consider eating more fruits, vegetables, and lean baked meats such as chicken or fish. Moderate intensity exercise at least 150 minutes as tolerated per week may help as well. Patient encouraged to schedule appointment to have cholesterol rechecked in 6 months or sooner if needed.   Your hemoglobin A1c is consistent with pre-diabetes. Practice healthy eating habits of fresh fruit and vegetables, lean baked meats such as chicken, fish, and [place]; limit breads, rice, pastas, and desserts; practice regular aerobic exercise (at least 150 minutes a week as tolerated). Patient encouraged to schedule appointment to have prediabetes rechecked in 6 months or sooner if needed.   Your iron is low. Two iron infusions have been ordered for this. The nurse will call you to schedule appointments for iron infusions. Also, consider trying to increase iron-rich foods in your diet such as dark green leafy vegetables and dried fruit such as raisins and apricots.

## 2020-09-19 DIAGNOSIS — F419 Anxiety disorder, unspecified: Secondary | ICD-10-CM | POA: Insufficient documentation

## 2020-09-19 DIAGNOSIS — F32A Depression, unspecified: Secondary | ICD-10-CM | POA: Insufficient documentation

## 2020-09-21 ENCOUNTER — Encounter: Payer: Self-pay | Admitting: Family

## 2020-09-21 ENCOUNTER — Telehealth: Payer: Self-pay

## 2020-09-21 ENCOUNTER — Telehealth: Payer: Self-pay | Admitting: Family

## 2020-09-21 NOTE — Telephone Encounter (Signed)
Spoke w/pt to advise that iron infusions had been scheduled. Pt stated that she called [facility] to see if there were any other days due to childs bday, they advised it would be further out. Pt stated she would try to make appt

## 2020-09-21 NOTE — Telephone Encounter (Signed)
Pt called requesting PCP send her to a different place for iron infusion.   Pt wants to have it before 3/29 if possible   Pt says she called and there are no other appts available at the [facility] she is scheduled at.

## 2020-09-24 ENCOUNTER — Other Ambulatory Visit: Payer: Self-pay

## 2020-09-24 DIAGNOSIS — D509 Iron deficiency anemia, unspecified: Secondary | ICD-10-CM

## 2020-09-24 MED ORDER — SODIUM CHLORIDE 0.9 % IV SOLN: INTRAVENOUS | 0 refills | Status: DC

## 2020-09-28 ENCOUNTER — Ambulatory Visit (HOSPITAL_COMMUNITY)
Admission: RE | Admit: 2020-09-28 | Discharge: 2020-09-28 | Disposition: A | Discharge status: Home / Self Care | Payer: BC Managed Care – PPO | Source: Ambulatory Visit | Attending: Internal Medicine | Admitting: Internal Medicine

## 2020-09-28 ENCOUNTER — Other Ambulatory Visit: Payer: Self-pay

## 2020-09-28 DIAGNOSIS — D509 Iron deficiency anemia, unspecified: Secondary | ICD-10-CM | POA: Insufficient documentation

## 2020-09-28 MED ORDER — SODIUM CHLORIDE 0.9 % IV SOLN
510.0000 mg | Freq: Once | INTRAVENOUS | Status: AC
  Administered 2020-09-28: 510 mg via INTRAVENOUS
  Filled 2020-09-28: qty 510

## 2020-09-28 MED ORDER — SODIUM CHLORIDE 0.9 % IV SOLN
INTRAVENOUS | Status: DC | PRN
  Administered 2020-09-28: 250 mL via INTRAVENOUS

## 2020-09-28 NOTE — Discharge Instructions (Signed)
Ferumoxytol injection What is this medicine? FERUMOXYTOL is an iron complex. Iron is used to make healthy red blood cells, which carry oxygen and nutrients throughout the body. This medicine is used to treat iron deficiency anemia. This medicine may be used for other purposes; ask your health care provider or pharmacist if you have questions. COMMON BRAND NAME(S): Feraheme What should I tell my health care provider before I take this medicine? They need to know if you have any of these conditions:  anemia not caused by low iron levels  high levels of iron in the blood  magnetic resonance imaging (MRI) test scheduled  an unusual or allergic reaction to iron, other medicines, foods, dyes, or preservatives  pregnant or trying to get pregnant  breast-feeding How should I use this medicine? This medicine is for injection into a vein. It is given by a health care professional in a hospital or clinic setting. Talk to your pediatrician regarding the use of this medicine in children. Special care may be needed. Overdosage: If you think you have taken too much of this medicine contact a poison control center or emergency room at once. NOTE: This medicine is only for you. Do not share this medicine with others. What if I miss a dose? It is important not to miss your dose. Call your doctor or health care professional if you are unable to keep an appointment. What may interact with this medicine? This medicine may interact with the following medications:  other iron products This list may not describe all possible interactions. Give your health care provider a list of all the medicines, herbs, non-prescription drugs, or dietary supplements you use. Also tell them if you smoke, drink alcohol, or use illegal drugs. Some items may interact with your medicine. What should I watch for while using this medicine? Visit your doctor or healthcare professional regularly. Tell your doctor or healthcare  professional if your symptoms do not start to get better or if they get worse. You may need blood work done while you are taking this medicine. You may need to follow a special diet. Talk to your doctor. Foods that contain iron include: whole grains/cereals, dried fruits, beans, or peas, leafy green vegetables, and organ meats (liver, kidney). What side effects may I notice from receiving this medicine? Side effects that you should report to your doctor or health care professional as soon as possible:  allergic reactions like skin rash, itching or hives, swelling of the face, lips, or tongue  breathing problems  changes in blood pressure  feeling faint or lightheaded, falls  fever or chills  flushing, sweating, or hot feelings  swelling of the ankles or feet Side effects that usually do not require medical attention (report to your doctor or health care professional if they continue or are bothersome):  diarrhea  headache  nausea, vomiting  stomach pain This list may not describe all possible side effects. Call your doctor for medical advice about side effects. You may report side effects to FDA at 1-800-FDA-1088. Where should I keep my medicine? This drug is given in a hospital or clinic and will not be stored at home. NOTE: This sheet is a summary. It may not cover all possible information. If you have questions about this medicine, talk to your doctor, pharmacist, or health care provider.  2021 Elsevier/Gold Standard (2016-08-07 20:21:10)  

## 2020-09-28 NOTE — Progress Notes (Signed)
Patient received IV  Feraheme (Dose 1 of 2) as ordered by Delfin Gant NP. Observed for at least 30 minutes post infusion. Tolerated well, vitals stable, discharge instructions given , verbalized understanding. Pt states she is already scheduled for her second infusion. Patient alert, oriented, and ambulatory at the time of discharge.

## 2020-10-04 ENCOUNTER — Encounter (HOSPITAL_COMMUNITY): Payer: BC Managed Care – PPO

## 2020-10-05 DIAGNOSIS — F900 Attention-deficit hyperactivity disorder, predominantly inattentive type: Secondary | ICD-10-CM | POA: Diagnosis not present

## 2020-10-08 ENCOUNTER — Non-Acute Institutional Stay (HOSPITAL_COMMUNITY)
Admission: RE | Admit: 2020-10-08 | Discharge: 2020-10-08 | Disposition: A | Discharge status: Home / Self Care | Payer: BC Managed Care – PPO | Source: Ambulatory Visit | Attending: Internal Medicine | Admitting: Internal Medicine

## 2020-10-08 ENCOUNTER — Other Ambulatory Visit: Payer: Self-pay

## 2020-10-08 DIAGNOSIS — D509 Iron deficiency anemia, unspecified: Secondary | ICD-10-CM | POA: Insufficient documentation

## 2020-10-08 MED ORDER — SODIUM CHLORIDE 0.9 % IV SOLN
510.0000 mg | Freq: Once | INTRAVENOUS | Status: AC
  Administered 2020-10-08: 510 mg via INTRAVENOUS
  Filled 2020-10-08: qty 510

## 2020-10-08 MED ORDER — SODIUM CHLORIDE 0.9 % IV SOLN
INTRAVENOUS | Status: DC | PRN
  Administered 2020-10-08: 250 mL via INTRAVENOUS

## 2020-10-08 NOTE — Progress Notes (Signed)
Patient received IV Feraheme as ordered by Amy Stephens NP. Observed for at least 30 minutes post infusion.Tolerated well, vitals stable, discharge instructions given, verbalized understanding. Patient alert, oriented and ambulatory at the time of discharge.  

## 2020-10-28 DIAGNOSIS — F900 Attention-deficit hyperactivity disorder, predominantly inattentive type: Secondary | ICD-10-CM | POA: Diagnosis not present

## 2020-11-23 ENCOUNTER — Telehealth: Payer: Self-pay | Admitting: Family

## 2020-11-23 NOTE — Telephone Encounter (Signed)
Pt states she is still feeling fatigued even after the Iron infusions. Pt wants to know what else she needs to do. Please advise and thank you

## 2020-11-23 NOTE — Telephone Encounter (Signed)
Patient should have repeat CBC at least 4 weeks after second iron infusion. If this applies to the patient please schedule lab only appointment.

## 2020-11-24 NOTE — Telephone Encounter (Signed)
Att to contact pt to advise that lab appt needed for CBC, no ans lvm

## 2020-11-25 ENCOUNTER — Other Ambulatory Visit: Payer: Self-pay

## 2020-11-25 ENCOUNTER — Other Ambulatory Visit: Payer: BC Managed Care – PPO

## 2020-11-25 DIAGNOSIS — D509 Iron deficiency anemia, unspecified: Secondary | ICD-10-CM

## 2020-11-25 NOTE — Progress Notes (Signed)
CBC ordered 

## 2020-11-26 ENCOUNTER — Other Ambulatory Visit: Payer: Self-pay | Admitting: Family

## 2020-11-26 ENCOUNTER — Encounter: Payer: Self-pay | Admitting: Family

## 2020-11-26 DIAGNOSIS — R5382 Chronic fatigue, unspecified: Secondary | ICD-10-CM

## 2020-11-26 DIAGNOSIS — D509 Iron deficiency anemia, unspecified: Secondary | ICD-10-CM

## 2020-11-26 LAB — CBC
Hematocrit: 42.1 % (ref 34.0–46.6)
Hemoglobin: 14.2 g/dL (ref 11.1–15.9)
MCH: 28.9 pg (ref 26.6–33.0)
MCHC: 33.7 g/dL (ref 31.5–35.7)
MCV: 86 fL (ref 79–97)
Platelets: 266 10*3/uL (ref 150–450)
RBC: 4.92 x10E6/uL (ref 3.77–5.28)
RDW: 15.6 % — ABNORMAL HIGH (ref 11.7–15.4)
WBC: 5.5 10*3/uL (ref 3.4–10.8)

## 2020-11-26 NOTE — Progress Notes (Signed)
Anemia back to baseline since iron infusions.   In regards to continued fatigue sometimes anxiety and depression may cause this. Keep appointments with Psychiatry for further evaluation and management of this possibility.   Also, referral placed to Rheumatology for further evaluation of chronic fatigue. Their office should call patient within 2 weeks with appointment details.

## 2020-11-29 DIAGNOSIS — N3000 Acute cystitis without hematuria: Secondary | ICD-10-CM | POA: Diagnosis not present

## 2020-11-29 DIAGNOSIS — N390 Urinary tract infection, site not specified: Secondary | ICD-10-CM | POA: Diagnosis not present

## 2020-12-02 ENCOUNTER — Ambulatory Visit: Payer: BC Managed Care – PPO | Attending: Physician Assistant

## 2020-12-02 ENCOUNTER — Other Ambulatory Visit: Payer: Self-pay

## 2020-12-02 ENCOUNTER — Ambulatory Visit: Payer: BC Managed Care – PPO | Admitting: Physician Assistant

## 2020-12-02 VITALS — BP 127/80 | HR 72 | Temp 98.7°F | Resp 18

## 2020-12-02 DIAGNOSIS — N3000 Acute cystitis without hematuria: Secondary | ICD-10-CM | POA: Diagnosis not present

## 2020-12-02 DIAGNOSIS — D509 Iron deficiency anemia, unspecified: Secondary | ICD-10-CM | POA: Diagnosis not present

## 2020-12-02 LAB — POCT URINALYSIS DIP (CLINITEK)
Bilirubin, UA: NEGATIVE
Blood, UA: NEGATIVE
Glucose, UA: NEGATIVE mg/dL
Ketones, POC UA: NEGATIVE mg/dL
Leukocytes, UA: NEGATIVE
Nitrite, UA: NEGATIVE
POC PROTEIN,UA: NEGATIVE
Spec Grav, UA: 1.01 (ref 1.010–1.025)
Urobilinogen, UA: 0.2 E.U./dL
pH, UA: 7 (ref 5.0–8.0)

## 2020-12-02 MED ORDER — CEFTRIAXONE SODIUM 500 MG IJ SOLR
500.0000 mg | Freq: Once | INTRAMUSCULAR | Status: AC
  Administered 2020-12-02: 500 mg via INTRAMUSCULAR

## 2020-12-02 NOTE — Progress Notes (Signed)
Patient arrive to office, Primary Care at [facility] for Rocephin injection and lab work.   Patient here today for injection.  Rocephin 500 mg given today left upper outer quadrant.  Site unremarkable & patient tolerated injection. Guy Franco, RN,BSN

## 2020-12-02 NOTE — Patient Instructions (Signed)
Please present to  Primary Care at [facility] for your injection of Rocephin.  Please feel free to return to the mobile unit if needed.  I hope that you feel better soon.  Jocelyn Jaffe, PA-C Physician Assistant [facility] [email or web address]    Urinary Tract Infection, Adult  A urinary tract infection (UTI) is an infection of any part of the urinary tract. The urinary tract includes the kidneys, ureters, bladder, and urethra. These organs make, store, and get rid of urine in the body. An upper UTI affects the ureters and kidneys. A lower UTI affects the bladder and urethra. What are the causes? Most urinary tract infections are caused by bacteria in your genital area around your urethra, where urine leaves your body. These bacteria grow and cause inflammation of your urinary tract. What increases the risk? You are more likely to develop this condition if:  You have a urinary catheter that stays in place.  You are not able to control when you urinate or have a bowel movement (incontinence).  You are female and you: ? Use a spermicide or diaphragm for birth control. ? Have low estrogen levels. ? Are pregnant.  You have certain genes that increase your risk.  You are sexually active.  You take antibiotic medicines.  You have a condition that causes your flow of urine to slow down, such as: ? An enlarged prostate, if you are female. ? Blockage in your urethra. ? A kidney stone. ? A nerve condition that affects your bladder control (neurogenic bladder). ? Not getting enough to drink, or not urinating often.  You have certain medical conditions, such as: ? Diabetes. ? A weak disease-fighting system (immunesystem). ? Sickle cell disease. ? Gout. ? Spinal cord injury. What are the signs or symptoms? Symptoms of this condition include:  Needing to urinate right away (urgency).  Frequent urination. This may include  small amounts of urine each time you urinate.  Pain or burning with urination.  Blood in the urine.  Urine that smells bad or unusual.  Trouble urinating.  Cloudy urine.  Vaginal discharge, if you are female.  Pain in the abdomen or the lower back. You may also have:  Vomiting or a decreased appetite.  Confusion.  Irritability or tiredness.  A fever or chills.  Diarrhea. The first symptom in older adults may be confusion. In some cases, they may not have any symptoms until the infection has worsened. How is this diagnosed? This condition is diagnosed based on your medical history and a physical exam. You may also have other tests, including:  Urine tests.  Blood tests.  Tests for STIs (sexually transmitted infections). If you have had more than one UTI, a cystoscopy or imaging studies may be done to determine the cause of the infections. How is this treated? Treatment for this condition includes:  Antibiotic medicine.  Over-the-counter medicines to treat discomfort.  Drinking enough water to stay hydrated. If you have frequent infections or have other conditions such as a kidney stone, you may need to see a health care provider who specializes in the urinary tract (urologist). In rare cases, urinary tract infections can cause sepsis. Sepsis is a life-threatening condition that occurs when the body responds to an infection. Sepsis is treated in the hospital with IV antibiotics, fluids, and other medicines. Follow these instructions at home: Medicines  Take over-the-counter and prescription medicines only as told by your health care provider.  If you were prescribed an antibiotic medicine, take  it as told by your health care provider. Do not stop using the antibiotic even if you start to feel better. General instructions  Make sure you: ? Empty your bladder often and completely. Do not hold urine for long periods of time. ? Empty your bladder after sex. ? Wipe  from front to back after urinating or having a bowel movement if you are female. Use each tissue only one time when you wipe.  Drink enough fluid to keep your urine pale yellow.  Keep all follow-up visits. This is important.   Contact a health care provider if:  Your symptoms do not get better after 1-2 days.  Your symptoms go away and then return. Get help right away if:  You have severe pain in your back or your lower abdomen.  You have a fever or chills.  You have nausea or vomiting. Summary  A urinary tract infection (UTI) is an infection of any part of the urinary tract, which includes the kidneys, ureters, bladder, and urethra.  Most urinary tract infections are caused by bacteria in your genital area.  Treatment for this condition often includes antibiotic medicines.  If you were prescribed an antibiotic medicine, take it as told by your health care provider. Do not stop using the antibiotic even if you start to feel better.  Keep all follow-up visits. This is important. This information is not intended to replace advice given to you by your health care provider. Make sure you discuss any questions you have with your health care provider. Document Revised: 01/30/2020 Document Reviewed: 01/30/2020 Elsevier Patient Education  2021 [employer].

## 2020-12-02 NOTE — Progress Notes (Signed)
Established Patient Office Visit  Subjective:  Patient ID: Jocelyn Sawyer, female    DOB: [birth year]  Age: 35 y.o. MRN: [id number]  CC:  Chief Complaint  Patient presents with  . Urinary Tract Infection    HPI Jocelyn Sawyer reports that she started having symptoms of a UTI on Monday, May 30.  Reports that she was experiencing dysuria, suprapubic discomfort, bilateral lower back pain.  Reports that she had a televisit and was prescribed Macrobid.  Reports that she has been taking it twice a day since Monday and states she has not had any improvement in her symptoms.  Reports that she has a history of urinary tract infections and was previously seen on the mobile unit in March 2022 with similar history, states the Macrobid that she was given prior to that visit was not offering relief, was given a shot of Rocephin at that visit with relief.  Past Medical History:  Diagnosis Date  . ADHD (attention deficit hyperactivity disorder)   . Asthma   . GERD (gastroesophageal reflux disease)   . IDA (iron deficiency anemia)     Past Surgical History:  Procedure Laterality Date  . CESAREAN SECTION     x 3  . TONSILLECTOMY    . TUBAL LIGATION      Family History  Problem Relation Age of Onset  . Hypertension Mother   . Bipolar disorder Mother   . Kidney disease Mother   . Throat cancer Father        squamous cell  . Breast cancer Maternal Grandmother   . Leukemia Maternal Grandmother   . Heart attack Maternal Grandfather   . Bone cancer Paternal Grandmother   . Suicidality Paternal Grandfather     Social History   Socioeconomic History  . Marital status: Married    Spouse name: Not on file  . Number of children: 3  . Years of education: Not on file  . Highest education level: Not on file  Occupational History  . Occupation: Nurse  Tobacco Use  . Smoking status: Never Smoker  . Smokeless tobacco: Never Used  Vaping Use  . Vaping Use: Never used  Substance and  Sexual Activity  . Alcohol use: Not Currently  . Drug use: Never  . Sexual activity: Yes    Birth control/protection: None  Other Topics Concern  . Not on file  Social History Narrative  . Not on file   Social Determinants of Health   Financial Resource Strain: Not on file  Food Insecurity: Not on file  Transportation Needs: Not on file  Physical Activity: Not on file  Stress: Not on file  Social Connections: Not on file  Intimate Partner Violence: Not on file    Outpatient Medications Prior to Visit  Medication Sig Dispense Refill  . albuterol (VENTOLIN HFA) 108 (90 Base) MCG/ACT inhaler Inhale into the lungs.    Marland Kitchen amphetamine-dextroamphetamine (ADDERALL XR) 10 MG 24 hr capsule Take by mouth.    . dexlansoprazole (DEXILANT) 60 MG capsule Take 1 capsule (60 mg total) by mouth daily. 90 capsule 3  . ferumoxytol in sodium chloride 0.9 % 100 mL Ordered dose: 510 mg Route: intravenous Frequency: once @ 468 mL/hr over 15 minutes Dispense: 1 each Admin instructions: Monitor patients carefully for allergic reactions during Feraheme administration and for at least 30 minutes following each infusion. Allow minimum of 30 minutes between Feraheme and admin of meds that could potentially cause hypersensitivity reactions, such as chemotherapeutic agents or monoclonal antibodies.  Note: After 3 to 8 days administer second dose. 2 Dose 0  . Probiotic Product (PROBIOTIC-10 PO) Take by mouth.     No facility-administered medications prior to visit.    Allergies  Allergen Reactions  . Nickel Swelling  . Sulfa Antibiotics Hives    ROS Review of Systems  Constitutional: Negative for chills, fatigue and fever.  HENT: Negative.   Eyes: Negative.   Respiratory: Negative for shortness of breath.   Cardiovascular: Negative for chest pain.  Gastrointestinal: Positive for abdominal pain.  Endocrine: Negative.   Genitourinary: Positive for dysuria and urgency. Negative for hematuria and  vaginal discharge.  Musculoskeletal: Positive for back pain.  Skin: Negative.   Allergic/Immunologic: Negative.   Neurological: Negative.   Hematological: Negative.   Psychiatric/Behavioral: Negative.       Objective:    Physical Exam Vitals reviewed.  Constitutional:      Appearance: Normal appearance.  HENT:     Head: Normocephalic and atraumatic.     Right Ear: External ear normal.     Left Ear: External ear normal.     Nose: Nose normal.     Mouth/Throat:     Mouth: Mucous membranes are moist.     Pharynx: Oropharynx is clear.  Eyes:     Extraocular Movements: Extraocular movements intact.     Conjunctiva/sclera: Conjunctivae normal.     Pupils: Pupils are equal, round, and reactive to light.  Cardiovascular:     Rate and Rhythm: Normal rate and regular rhythm.     Pulses: Normal pulses.     Heart sounds: Normal heart sounds.  Pulmonary:     Effort: Pulmonary effort is normal.     Breath sounds: Normal breath sounds.  Abdominal:     Tenderness: There is abdominal tenderness in the suprapubic area. There is right CVA tenderness and left CVA tenderness.  Musculoskeletal:        General: Normal range of motion.     Cervical back: Normal range of motion and neck supple.  Skin:    General: Skin is warm and dry.  Neurological:     General: No focal deficit present.     Mental Status: She is alert and oriented to person, place, and time.  Psychiatric:        Mood and Affect: Mood normal.        Behavior: Behavior normal.        Thought Content: Thought content normal.        Judgment: Judgment normal.     BP 127/80 (BP Location: Left Arm, Patient Position: Sitting, Cuff Size: Normal)   Pulse 72   Temp 98.7 F (37.1 C) (Oral)   Resp 18   SpO2 100%  Wt Readings from Last 3 Encounters:  09/17/20 128 lb 3.2 oz (58.2 kg)  09/02/20 128 lb (58.1 kg)  08/18/20 133 lb 4 oz (60.4 kg)     Health Maintenance Due  Topic Date Due  . PAP SMEAR-Modifier  Never done     There are no preventive care reminders to display for this patient.  Lab Results  Component Value Date   TSH 1.800 09/17/2020   Lab Results  Component Value Date   WBC 5.5 11/25/2020   HGB 14.2 11/25/2020   HCT 42.1 11/25/2020   MCV 86 11/25/2020   PLT 266 11/25/2020   Lab Results  Component Value Date   NA 138 09/17/2020   K 4.1 09/17/2020   CO2 21 09/17/2020   GLUCOSE 87  09/17/2020   BUN 21 (H) 09/17/2020   CREATININE 1.05 (H) 09/17/2020   BILITOT <0.2 09/17/2020   ALKPHOS 49 09/17/2020   AST 21 09/17/2020   ALT 14 09/17/2020   PROT 7.3 09/17/2020   ALBUMIN 4.4 09/17/2020   CALCIUM 9.4 09/17/2020   EGFR 71 09/17/2020   Lab Results  Component Value Date   CHOL 178 09/17/2020   Lab Results  Component Value Date   HDL 56 09/17/2020   Lab Results  Component Value Date   LDLCALC 105 (H) 09/17/2020   Lab Results  Component Value Date   TRIG 96 09/17/2020   Lab Results  Component Value Date   CHOLHDL 3.2 09/17/2020   Lab Results  Component Value Date   HGBA1C 5.8 (H) 09/17/2020      Assessment & Plan:   Problem List Items Addressed This Visit      Genitourinary   Acute cystitis without hematuria - Primary   Relevant Orders   POCT URINALYSIS DIP (CLINITEK) (Completed)    1. Acute cystitis without hematuria UA did not show nitrates or leukocytes, however patient has been taking Macrobid since Monday.  We do not have a CMA available on the mobile unit, patient was sent to Primary Care at [facility] to receive 1 g of Rocephin.  Red flags given for prompt reevaluation. - POCT URINALYSIS DIP (CLINITEK)   I have reviewed the patient's medical history (PMH, PSH, Social History, Family History, Medications, and allergies) , and have been updated if relevant. I spent 26 minutes reviewing chart and  face to face time with patient.     No orders of the defined types were placed in this encounter.   Follow-up: No follow-ups on file.    Loraine Grip Mayers,  PA-C

## 2020-12-03 ENCOUNTER — Other Ambulatory Visit: Payer: BC Managed Care – PPO

## 2020-12-03 LAB — SPECIMEN STATUS REPORT

## 2020-12-03 LAB — IRON,TIBC AND FERRITIN PANEL
Ferritin: 203 ng/mL — ABNORMAL HIGH (ref 15–150)
Iron Saturation: 11 % — ABNORMAL LOW (ref 15–55)
Iron: 33 ug/dL (ref 27–159)
Total Iron Binding Capacity: 291 ug/dL (ref 250–450)
UIBC: 258 ug/dL (ref 131–425)

## 2020-12-04 ENCOUNTER — Other Ambulatory Visit: Payer: Self-pay | Admitting: Family

## 2020-12-04 DIAGNOSIS — D509 Iron deficiency anemia, unspecified: Secondary | ICD-10-CM

## 2020-12-04 NOTE — Progress Notes (Signed)
Ferritin higher than normal. This is suggestive of iron overload which is likely related to iron infusions.   Iron saturation has improved since 8 weeks ago but not at goal.  Repeat iron infusions not needed at this time.   Recommendation of referral to Rheumatology for continued fatigue.   Recommendation of referral to Hematology for further management of anemia.

## 2020-12-07 ENCOUNTER — Telehealth: Payer: Self-pay | Admitting: Hematology and Oncology

## 2020-12-07 NOTE — Telephone Encounter (Signed)
Scheduled appt per 6/4 referral. Pt aware.

## 2020-12-09 ENCOUNTER — Telehealth: Payer: Self-pay

## 2020-12-09 NOTE — Telephone Encounter (Signed)
Opened in error

## 2020-12-15 NOTE — Progress Notes (Signed)
[facility] CONSULT NOTE  Patient Care Team: Rema Fendt, NP as PCP - General (Nurse Practitioner)  CHIEF COMPLAINTS/PURPOSE OF CONSULTATION:  Newly diagnosed breast cancer  HISTORY OF PRESENTING ILLNESS:  Jocelyn Sawyer 35 y.o. female is here because of recent diagnosis of iron deficiency anemia. She was referred by Ricky Stabs, NP. She reported symptoms of fatigue, hair loss, and night sweats. Labs on 12/02/2020 showed Iron sat 11, Ferritin 203, WBC 5.5, RBC 4.92, HGB 14.2, HCT, 42.1, and platelets 266. She presents to the clinic today for initial evaluation and discussion of treatment options.  She has longstanding history of iron deficiency and has been on oral iron therapy previously.  Because of her fatigue issues since her baby had been born about a year ago, she received IV iron a couple of months ago and soon after she felt much better.  Repeat labs showed a ferritin of 203.  Prior to iron infusion her ferritin was 10.  She was referred to [place] because she currently has the same symptoms that she had prior to IV iron infusion with regards to the fatigue however the iron levels are much better.  She was sent to [place] to discuss if she could benefit from additional intravenous iron therapy.  Since delivery, menstrual cycles resume she has been having long and heavier bleeding.  I reviewed her records extensively and collaborated the history with the patient.  MEDICAL HISTORY:  Past Medical History:  Diagnosis Date   ADHD (attention deficit hyperactivity disorder)    Asthma    GERD (gastroesophageal reflux disease)    IDA (iron deficiency anemia)     SURGICAL HISTORY: Past Surgical History:  Procedure Laterality Date   CESAREAN SECTION     x 3   TONSILLECTOMY     TUBAL LIGATION      SOCIAL HISTORY: Social History   Socioeconomic History   Marital status: Married    Spouse name: Not on file   Number of children: 3   Years of education: Not on file    Highest education level: Not on file  Occupational History   Occupation: Nurse  Tobacco Use   Smoking status: Never   Smokeless tobacco: Never  Vaping Use   Vaping Use: Never used  Substance and Sexual Activity   Alcohol use: Not Currently   Drug use: Never   Sexual activity: Yes    Birth control/protection: None  Other Topics Concern   Not on file  Social History Narrative   Not on file   Social Determinants of Health   Financial Resource Strain: Not on file  Food Insecurity: Not on file  Transportation Needs: Not on file  Physical Activity: Not on file  Stress: Not on file  Social Connections: Not on file  Intimate Partner Violence: Not on file    FAMILY HISTORY: Family History  Problem Relation Age of Onset   Hypertension Mother    Bipolar disorder Mother    Kidney disease Mother    Throat cancer Father        squamous cell   Breast cancer Maternal Grandmother    Leukemia Maternal Grandmother    Heart attack Maternal Grandfather    Bone cancer Paternal Grandmother    Suicidality Paternal Grandfather     ALLERGIES:  is allergic to nickel and sulfa antibiotics.  MEDICATIONS:  Current Outpatient Medications  Medication Sig Dispense Refill   albuterol (VENTOLIN HFA) 108 (90 Base) MCG/ACT inhaler Inhale into the lungs.  amphetamine-dextroamphetamine (ADDERALL XR) 10 MG 24 hr capsule Take by mouth.     dexlansoprazole (DEXILANT) 60 MG capsule Take 1 capsule (60 mg total) by mouth daily. 90 capsule 3   ferumoxytol in sodium chloride 0.9 % 100 mL Ordered dose: 510 mg Route: intravenous Frequency: once @ 468 mL/hr over 15 minutes Dispense: 1 each Admin instructions: Monitor patients carefully for allergic reactions during Feraheme administration and for at least 30 minutes following each infusion. Allow minimum of 30 minutes between Feraheme and admin of meds that could potentially cause hypersensitivity reactions, such as chemotherapeutic agents or monoclonal  antibodies. Note: After 3 to 8 days administer second dose. 2 Dose 0   Probiotic Product (PROBIOTIC-10 PO) Take by mouth.     No current facility-administered medications for this visit.    REVIEW OF SYSTEMS:   Constitutional: Generalized fatigue and weakness  All other systems were reviewed with the patient and are negative.  PHYSICAL EXAMINATION: ECOG PERFORMANCE STATUS: 2 - Symptomatic, <50% confined to bed  Vitals:   12/16/20 1549  BP: 117/70  Pulse: 60  Resp: 18  Temp: 97.8 F (36.6 C)  SpO2: 100%   Filed Weights   12/16/20 1549  Weight: 130 lb 9.6 oz (59.2 kg)     LABORATORY DATA:  I have reviewed the data as listed Lab Results  Component Value Date   WBC 5.5 11/25/2020   HGB 14.2 11/25/2020   HCT 42.1 11/25/2020   MCV 86 11/25/2020   PLT 266 11/25/2020   Lab Results  Component Value Date   NA 138 09/17/2020   K 4.1 09/17/2020   CL 102 09/17/2020   CO2 21 09/17/2020    RADIOGRAPHIC STUDIES: I have personally reviewed the radiological reports and agreed with the findings in the report.  ASSESSMENT AND PLAN:  Iron deficiency anemia Longstanding iron deficiency anemia treated with oral iron therapy. Intravenous iron given March 2022 with improvement of ferritin from 10-203.  She was sent to [place] for discussion regarding additional IV iron treatments. She continues to have fatigue issues.  She used to have cravings for ice chips which have resolved after the IV iron treatment.  12/02/2020 showed Iron sat 11, Ferritin 203, WBC 5.5, RBC 4.92, HGB 14.2, HCT, 42.1, and platelets 266 Based on the above lab results, I did not recommend IV iron treatment. Previous work-up revealed no evidence of B12 deficiency or thyroid abnormalities.  I discussed with her the entire mechanism of iron absorption.  Given the fact that her hemoglobin is excellent and her ferritin is 203, we do not recommend any additional IV iron treatment.  Return to clinic on an as-needed  basis.  All questions were answered. The patient knows to call the clinic with any problems, questions or concerns.   Sabas Sous, MD, MPH 12/16/2020    I, Alda Ponder, am acting as scribe for Serena Croissant, MD.  I have reviewed the above documentation for accuracy and completeness, and I agree with the above.

## 2020-12-16 ENCOUNTER — Other Ambulatory Visit: Payer: Self-pay

## 2020-12-16 ENCOUNTER — Inpatient Hospital Stay: Payer: BC Managed Care – PPO | Attending: Hematology and Oncology | Admitting: Hematology and Oncology

## 2020-12-16 DIAGNOSIS — D509 Iron deficiency anemia, unspecified: Secondary | ICD-10-CM | POA: Diagnosis not present

## 2020-12-16 DIAGNOSIS — Z806 Family history of leukemia: Secondary | ICD-10-CM | POA: Diagnosis not present

## 2020-12-16 DIAGNOSIS — Z803 Family history of malignant neoplasm of breast: Secondary | ICD-10-CM | POA: Insufficient documentation

## 2020-12-16 DIAGNOSIS — Z808 Family history of malignant neoplasm of other organs or systems: Secondary | ICD-10-CM | POA: Insufficient documentation

## 2020-12-16 NOTE — Assessment & Plan Note (Signed)
12/02/2020 showed Iron sat 11, Ferritin 203, WBC 5.5, RBC 4.92, HGB 14.2, HCT, 42.1, and platelets 266

## 2021-01-17 ENCOUNTER — Other Ambulatory Visit: Payer: Self-pay

## 2021-01-17 MED ORDER — DEXLANSOPRAZOLE 60 MG PO CPDR
60.0000 mg | DELAYED_RELEASE_CAPSULE | Freq: Every day | ORAL | 3 refills | Status: DC
Start: 2021-01-17 — End: 2021-08-05

## 2021-01-20 DIAGNOSIS — F900 Attention-deficit hyperactivity disorder, predominantly inattentive type: Secondary | ICD-10-CM | POA: Diagnosis not present

## 2021-01-26 ENCOUNTER — Encounter: Payer: Self-pay | Admitting: Emergency Medicine

## 2021-01-26 ENCOUNTER — Other Ambulatory Visit: Payer: Self-pay

## 2021-01-26 ENCOUNTER — Ambulatory Visit
Admission: EM | Admit: 2021-01-26 | Discharge: 2021-01-26 | Disposition: A | Discharge status: Home / Self Care | Payer: BC Managed Care – PPO | Attending: Emergency Medicine | Admitting: Emergency Medicine

## 2021-01-26 DIAGNOSIS — M545 Low back pain, unspecified: Secondary | ICD-10-CM | POA: Diagnosis not present

## 2021-01-26 DIAGNOSIS — R1030 Lower abdominal pain, unspecified: Secondary | ICD-10-CM | POA: Diagnosis not present

## 2021-01-26 LAB — POCT URINALYSIS DIP (MANUAL ENTRY)
Bilirubin, UA: NEGATIVE
Blood, UA: NEGATIVE
Glucose, UA: NEGATIVE mg/dL
Ketones, POC UA: NEGATIVE mg/dL
Leukocytes, UA: NEGATIVE
Nitrite, UA: NEGATIVE
Protein Ur, POC: NEGATIVE mg/dL
Spec Grav, UA: 1.02 (ref 1.010–1.025)
Urobilinogen, UA: 0.2 E.U./dL
pH, UA: 6.5 (ref 5.0–8.0)

## 2021-01-26 LAB — POCT URINE PREGNANCY: Preg Test, Ur: NEGATIVE

## 2021-01-26 MED ORDER — NAPROXEN 500 MG PO TABS: 500.0000 mg | ORAL_TABLET | Freq: Two times a day (BID) | ORAL | 0 refills | Status: DC

## 2021-01-26 NOTE — Discharge Instructions (Addendum)
Urine normal, culture pending Naprosyn for pain in the meantime Continue plenty of fluids Follow up with OBGYN if continuing

## 2021-01-26 NOTE — ED Provider Notes (Signed)
[facility]    CSN: [id number] Arrival date & time: 01/26/21  0949      History   Chief Complaint Chief Complaint  Patient presents with   Back Pain    HPI Jocelyn Sawyer is a 35 y.o. female history of GERD, IDA, presenting today for evaluation of bladder and back pain.  Reports 4 days of lower abdominal and lower back pain.  Symptoms worse at nighttime, waking her up out of sleep.  Denies associated UTI symptoms.  Reports in the past 6 months she has had 2 UTIs with more dysuria, frequency urgency and incomplete voiding.  Denies specific injury or trauma or increase in activity to back.  Denies history of stones.  Denies any vaginal discharge itching or irritation.  Denies any new partners or recent sexual intercourse due to discomfort.  Reports more frequent pain and discomfort since her last C-section 14 months ago.  Recently moved to the area, does not have OB/GYN established yet.  HPI  Past Medical History:  Diagnosis Date   ADHD (attention deficit hyperactivity disorder)    Asthma    GERD (gastroesophageal reflux disease)    IDA (iron deficiency anemia)     Patient Active Problem List   Diagnosis Date Noted   Anxiety and depression 09/19/2020   Iron deficiency anemia 09/18/2020   Acute cystitis without hematuria 09/03/2020   ADHD (attention deficit hyperactivity disorder) 08/06/2020   Asthma 08/06/2020   Uterine size date discrepancy pregnancy 08/06/2020   [redacted] weeks gestation of pregnancy 11/04/2019   History of prior pregnancy with IUGR newborn 10/20/2019   Epigastric pain 07/09/2018   Gastro-esophageal reflux disease without esophagitis 05/15/2018   Iron deficiency 11/15/2017   History of cesarean delivery affecting pregnancy 09/29/2015   History of uterine scar from previous surgery 09/29/2015   Indication for care in labor and delivery, antepartum 09/29/2015   Essential hypertension in pregnancy 08/26/2015    Past Surgical History:  Procedure  Laterality Date   CESAREAN SECTION     x 3   TONSILLECTOMY     TUBAL LIGATION      OB History   No obstetric history on file.      Home Medications    Prior to Admission medications   Medication Sig Start Date End Date Taking? Authorizing Provider  naproxen (NAPROSYN) 500 MG tablet Take 1 tablet (500 mg total) by mouth 2 (two) times daily. 01/26/21  Yes Somer Trotter C, PA-C  albuterol (VENTOLIN HFA) 108 (90 Base) MCG/ACT inhaler Inhale into the lungs. 05/23/16   [provider]  amphetamine-dextroamphetamine (ADDERALL XR) 10 MG 24 hr capsule Take by mouth. 10/20/16   [provider]  dexlansoprazole (DEXILANT) 60 MG capsule Take 1 capsule (60 mg total) by mouth daily. 01/17/21   Hilarie Fredrickson, MD  ferumoxytol in sodium chloride 0.9 % 100 mL Ordered dose: 510 mg Route: intravenous Frequency: once @ 468 mL/hr over 15 minutes Dispense: 1 each Admin instructions: Monitor patients carefully for allergic reactions during Feraheme administration and for at least 30 minutes following each infusion. Allow minimum of 30 minutes between Feraheme and admin of meds that could potentially cause hypersensitivity reactions, such as chemotherapeutic agents or monoclonal antibodies. Note: After 3 to 8 days administer second dose. 09/24/20   Rema Fendt, NP  Probiotic Product (PROBIOTIC-10 PO) Take by mouth.    [provider]    Family History Family History  Problem Relation Age of Onset   Hypertension Mother  Bipolar disorder Mother    Kidney disease Mother    Throat cancer Father        squamous cell   Breast cancer Maternal Grandmother    Leukemia Maternal Grandmother    Heart attack Maternal Grandfather    Bone cancer Paternal Grandmother    Suicidality Paternal Grandfather     Social History Social History   Tobacco Use   Smoking status: Never   Smokeless tobacco: Never  Vaping Use   Vaping Use: Never used  Substance Use Topics   Alcohol use:  Not Currently   Drug use: Never     Allergies   Nickel and Sulfa antibiotics   Review of Systems Review of Systems  Constitutional:  Negative for fever.  Respiratory:  Negative for shortness of breath.   Cardiovascular:  Negative for chest pain.  Gastrointestinal:  Positive for abdominal pain. Negative for diarrhea, nausea and vomiting.  Genitourinary:  Negative for dysuria, flank pain, genital sores, hematuria, menstrual problem, vaginal bleeding, vaginal discharge and vaginal pain.  Musculoskeletal:  Positive for back pain.  Skin:  Negative for rash.  Neurological:  Negative for dizziness, light-headedness and headaches.    Physical Exam Triage Vital Signs ED Triage Vitals  Enc Vitals Group     BP      Pulse      Resp      Temp      Temp src      SpO2      Weight      Height      Head Circumference      Peak Flow      Pain Score      Pain Loc      Pain Edu?      Excl. in GC?    No data found.  Updated Vital Signs BP 129/86   Pulse 89   Temp 98.1 F (36.7 C)   Resp 18   SpO2 99%   Visual Acuity Right Eye Distance:   Left Eye Distance:   Bilateral Distance:    Right Eye Near:   Left Eye Near:    Bilateral Near:     Physical Exam Vitals and nursing note reviewed.  Constitutional:      Appearance: She is well-developed.     Comments: No acute distress  HENT:     Head: Normocephalic and atraumatic.     Nose: Nose normal.  Eyes:     Conjunctiva/sclera: Conjunctivae normal.  Cardiovascular:     Rate and Rhythm: Normal rate and regular rhythm.  Pulmonary:     Effort: Pulmonary effort is normal. No respiratory distress.     Comments: Breathing comfortably at rest, CTABL, no wheezing, rales or other adventitious sounds auscultated  Abdominal:     General: There is no distension.     Comments: Soft, nondistended, tender to palpation in suprapubic area  Musculoskeletal:        General: Normal range of motion.     Cervical back: Neck supple.      Comments: Patient points to middle lower lumbar/upper sacral area as source of pain, no reproducible tenderness in the area  Skin:    General: Skin is warm and dry.  Neurological:     Mental Status: She is alert and oriented to person, place, and time.     UC Treatments / Results  Labs (all labs ordered are listed, but only abnormal results are displayed) Labs Reviewed  POCT URINALYSIS DIP (MANUAL ENTRY) - Abnormal;  Notable for the following components:      Result Value   Color, UA straw (*)    All other components within normal limits  URINE CULTURE  POCT URINE PREGNANCY    EKG   Radiology No results found.  Procedures Procedures (including critical care time)  Medications Ordered in UC Medications - No data to display  Initial Impression / Assessment and Plan / UC Course  I have reviewed the triage vital signs and the nursing notes.  Pertinent labs & imaging results that were available during my care of the patient were reviewed by me and considered in my medical decision making (see chart for details).     UA with negative leuks and nitrites, negative hemoglobin, less suspicious of UTI, but given history of recent UTIs we will send for culture to more definitively rule out.  Treating with anti-inflammatories in the meantime for pain, encouraged follow-up with OB/GYN if culture negative and symptoms persistent.  Contact provided.  Discussed strict return precautions. Patient verbalized understanding and is agreeable with plan.  Final Clinical Impressions(s) / UC Diagnoses   Final diagnoses:  Acute midline low back pain without sciatica  Lower abdominal pain     Discharge Instructions      Urine normal, culture pending Naprosyn for pain in the meantime Continue plenty of fluids Follow up with OBGYN if continuing     ED Prescriptions     Medication Sig Dispense Auth. Provider   naproxen (NAPROSYN) 500 MG tablet Take 1 tablet (500 mg total) by mouth 2  (two) times daily. 30 tablet Jendaya Gossett, [place] C, PA-C      PDMP not reviewed this encounter.   Sharyon Cable Patrick C, PA-C 01/26/21 1106

## 2021-01-26 NOTE — ED Triage Notes (Signed)
Pt is is present today with lower back pain and lower abdominal pain. Pt states that her sx started x4 days ago. Pt does have a hx  of recurrent UTI's

## 2021-01-27 LAB — URINE CULTURE: Culture: NO GROWTH

## 2021-01-28 DIAGNOSIS — Z113 Encounter for screening for infections with a predominantly sexual mode of transmission: Secondary | ICD-10-CM | POA: Diagnosis not present

## 2021-01-28 DIAGNOSIS — Z6822 Body mass index (BMI) 22.0-22.9, adult: Secondary | ICD-10-CM | POA: Diagnosis not present

## 2021-01-28 DIAGNOSIS — Z01419 Encounter for gynecological examination (general) (routine) without abnormal findings: Secondary | ICD-10-CM | POA: Diagnosis not present

## 2021-01-28 DIAGNOSIS — Z124 Encounter for screening for malignant neoplasm of cervix: Secondary | ICD-10-CM | POA: Diagnosis not present

## 2021-01-28 DIAGNOSIS — L659 Nonscarring hair loss, unspecified: Secondary | ICD-10-CM | POA: Diagnosis not present

## 2021-01-28 LAB — RESULTS CONSOLE HPV: CHL HPV: NEGATIVE

## 2021-01-28 LAB — HM PAP SMEAR: HM Pap smear: NORMAL

## 2021-01-31 ENCOUNTER — Ambulatory Visit: Payer: BC Managed Care – PPO | Admitting: Rheumatology

## 2021-02-10 ENCOUNTER — Other Ambulatory Visit: Payer: Self-pay

## 2021-02-10 ENCOUNTER — Ambulatory Visit: Payer: BC Managed Care – PPO | Admitting: Physician Assistant

## 2021-02-10 VITALS — BP 145/73 | HR 85 | Temp 98.5°F | Resp 16 | Ht 63.0 in | Wt 128.0 lb

## 2021-02-10 DIAGNOSIS — R3982 Chronic bladder pain: Secondary | ICD-10-CM

## 2021-02-10 DIAGNOSIS — N3001 Acute cystitis with hematuria: Secondary | ICD-10-CM | POA: Diagnosis not present

## 2021-02-10 LAB — POCT URINALYSIS DIP (CLINITEK)
Bilirubin, UA: NEGATIVE
Glucose, UA: NEGATIVE mg/dL
Ketones, POC UA: NEGATIVE mg/dL
Leukocytes, UA: NEGATIVE
Nitrite, UA: NEGATIVE
POC PROTEIN,UA: NEGATIVE
Spec Grav, UA: 1.01 (ref 1.010–1.025)
Urobilinogen, UA: 0.2 E.U./dL
pH, UA: 6 (ref 5.0–8.0)

## 2021-02-10 MED ORDER — CEFTRIAXONE SODIUM 500 MG IJ SOLR
500.0000 mg | Freq: Once | INTRAMUSCULAR | Status: AC
  Administered 2021-02-10: 500 mg via INTRAMUSCULAR

## 2021-02-10 MED ORDER — AMITRIPTYLINE HCL 10 MG PO TABS: 10.0000 mg | ORAL_TABLET | Freq: Every day | ORAL | 1 refills | Status: DC

## 2021-02-10 NOTE — Patient Instructions (Signed)
I have started a referral for you to be seen by urology for further evaluation.  You will take amitriptyline 10 mg at bedtime to help you with the bladder pain.  We will call you with today's lab results.  Please let us know if there is anything else we can do for you  Roney Jaffe, PA-C Physician Assistant [facility] [email or web address]   Interstitial Cystitis  Interstitial cystitis is inflammation of the bladder. This condition is also known as painful bladder syndrome. This may cause pain in the bladder area as well as a frequent and urgent need to urinate. The bladder is an organ thatstores urine after the urine is made in the kidneys. The severity of interstitial cystitis can vary from person to person. You may have flare-ups, and then your symptoms may go away for a while. For many people, it becomes a long-term (chronic) problem. What are the causes? The cause of this condition is not known. What increases the risk? The following factors may make you more likely to develop this condition: You are female. You have fibromyalgia. You have irritable bowel syndrome (IBS). You have endometriosis. This condition may be aggravated by: Stress. Smoking. Spicy foods. What are the signs or symptoms? Symptoms of interstitial cystitis vary, and they can change over time. Symptoms may include: Discomfort or pain in the bladder area, which is in the lower abdomen. Pain can range from mild to severe. The pain may change in intensity as the bladder fills with urine or as it empties. Pain in the pelvic area, between the hip bones. An urgent need to urinate. Frequent urination. Pain during urination. Pain during sex. Blood in the urine. Fatigue. For women, symptoms often get worse during menstruation. How is this diagnosed? This condition is diagnosed based on your symptoms, your medical history, and a physical exam. You may have  tests to rule out other conditions, such as: Urine tests. Cystoscopy. For this test, a tool similar to a very thin telescope is used to look into your bladder. Biopsy. This involves taking a sample of tissue from the bladder to be examined under a microscope. How is this treated? There is no cure for this condition, but treatment can help you control your symptoms. Work closely with your health care provider to find the most effective treatments for you. Treatment options may include: Medicines to relieve pain and reduce how often you feel the need to urinate. This treatment may include: A procedure where a small amount of medicine that eases irritation is put inside your bladder through a catheter (bladder instillation). Lifestyle changes, such as changing your diet or taking steps to control stress. Physical therapy. This may include: Exercises to help relax the pelvic floor muscles. Massage to relax tight muscles (myofascial release). Learning ways to control when you urinate (bladder training). Using a device that provides electrical stimulation to your nerves, which can relieve pain (neuromodulation therapy). The device is placed on your back, where it blocks the nerves that cause you to feel pain in your bladder area. A procedure that stretches your bladder by filling it with air or fluid (hydrodistention). Surgery. This is rare. It is only done for extreme cases, if other treatments do not help. Follow these instructions at home: Lifestyle Learn and practice relaxation techniques, such as deep breathing and muscle relaxation. Get care for your body and mental well-being, such as: Cognitive behavioral therapy (CBT). This therapy changes the way you think or act in response to  the fatigue. This may help improve how you feel. Seeing a mental health therapist to evaluate and treat depression, if necessary. Work with your health care provider on other ways to manage pain. Acupuncture may be  helpful. Avoid drinking alcohol. Do not use any products that contain nicotine or tobacco, such as cigarettes, e-cigarettes, and chewing tobacco. If you need help quitting, ask your health care provider. Eating and drinking Make dietary changes as recommended by your health care provider. You may need to avoid: Spicy foods. Foods that contain a lot of potassium. Limit your intake of drinks that make you need to urinate. These include: Caffeinated drinks like soda, coffee, and tea. Alcohol. Bladder training  Use bladder training techniques as directed. Techniques may include: Urinating at scheduled times. Training yourself to delay urination. Keep a bladder diary. Write down the times that you urinate and any symptoms that you have. This can help you find out which foods, liquids, or activities make your symptoms worse. Use your bladder diary to schedule bathroom trips. If you are away from home, plan to be near a bathroom at each of your scheduled times. Make sure that you urinate just before you leave the house and just before you go to bed.  General instructions Take over-the-counter and prescription medicines only as told by your health care provider. You can try a warm or cool compress over your bladder for comfort. Avoid wearing tight clothing. Do exercises to relax your pelvic floor muscles as told by your physical therapist. Keep all follow-up visits as told by your health care provider. This is important. Where to find more information To find more information or a support group near you, visit: [employer]: urologyhealth.org Interstitial Cystitis Association: [employer] Contact a health care provider if you have: Symptoms that do not get better with treatment. Pain or discomfort that gets worse. More frequent urges to urinate. A fever. Get help right away if: You have no control over when you urinate. Summary Interstitial cystitis is inflammation of the  bladder. This condition may cause pain in the bladder area as well as a frequent and urgent need to urinate. You may have flare-ups of the condition, and then it may go away for a while. For many people, it becomes a long-term (chronic) problem. There is no cure for interstitial cystitis, but treatment methods are available to control your symptoms. This information is not intended to replace advice given to you by your health care provider. Make sure you discuss any questions you have with your healthcare provider. Document Revised: 08/06/2019 Document Reviewed: 05/14/2017 Elsevier Patient Education  2021 [employer].

## 2021-02-10 NOTE — Progress Notes (Signed)
Established Patient Office Visit  Subjective:  Patient ID: Jocelyn Sawyer, female    DOB: 14-Feb-1986  Age: 35 y.o. MRN: [id number]  CC:  Chief Complaint  Patient presents with   bladder pain    HPI Jocelyn Sawyer reports that she has been experiencing bladder pain once again, states that she was seen at urgent care on January 26, 2021 for bladder pain and low back pain that has been present for the previous 4 days.  States that they ruled out a UTI and treated her with naproxen.  Reports that the naproxen did offer some relief until the last few days when the bladder pain returned with intermittent low back pain as well.  Reports of bladder pain much worse at night, states that she has increased frequency but unable to have complete voiding.  Has been using Tylenol and Azo with a small amount of relief.  Reports that she is unable to continue taking naproxen due to GI upset.   Past Medical History:  Diagnosis Date   ADHD (attention deficit hyperactivity disorder)    Asthma    GERD (gastroesophageal reflux disease)    IDA (iron deficiency anemia)     Past Surgical History:  Procedure Laterality Date   CESAREAN SECTION     x 3   TONSILLECTOMY     TUBAL LIGATION      Family History  Problem Relation Age of Onset   Hypertension Mother    Bipolar disorder Mother    Kidney disease Mother    Throat cancer Father        squamous cell   Breast cancer Maternal Grandmother    Leukemia Maternal Grandmother    Heart attack Maternal Grandfather    Bone cancer Paternal Grandmother    Suicidality Paternal Grandfather     Social History   Socioeconomic History   Marital status: Married    Spouse name: Not on file   Number of children: 3   Years of education: Not on file   Highest education level: Not on file  Occupational History   Occupation: Nurse  Tobacco Use   Smoking status: Never   Smokeless tobacco: Never  Vaping Use   Vaping Use: Never used  Substance and  Sexual Activity   Alcohol use: Not Currently   Drug use: Never   Sexual activity: Yes    Birth control/protection: None  Other Topics Concern   Not on file  Social History Narrative   Not on file   Social Determinants of Health   Financial Resource Strain: Not on file  Food Insecurity: Not on file  Transportation Needs: Not on file  Physical Activity: Not on file  Stress: Not on file  Social Connections: Not on file  Intimate Partner Violence: Not on file    Outpatient Medications Prior to Visit  Medication Sig Dispense Refill   albuterol (VENTOLIN HFA) 108 (90 Base) MCG/ACT inhaler Inhale into the lungs.     amphetamine-dextroamphetamine (ADDERALL XR) 10 MG 24 hr capsule Take by mouth.     dexlansoprazole (DEXILANT) 60 MG capsule Take 1 capsule (60 mg total) by mouth daily. 90 capsule 3   Probiotic Product (PROBIOTIC-10 PO) Take by mouth.     naproxen (NAPROSYN) 500 MG tablet Take 1 tablet (500 mg total) by mouth 2 (two) times daily. (Patient not taking: Reported on 02/10/2021) 30 tablet 0   ferumoxytol in sodium chloride 0.9 % 100 mL Ordered dose: 510 mg Route: intravenous Frequency: once @ 468 mL/hr  over 15 minutes Dispense: 1 each Admin instructions: Monitor patients carefully for allergic reactions during Feraheme administration and for at least 30 minutes following each infusion. Allow minimum of 30 minutes between Feraheme and admin of meds that could potentially cause hypersensitivity reactions, such as chemotherapeutic agents or monoclonal antibodies. Note: After 3 to 8 days administer second dose. (Patient not taking: Reported on 02/10/2021) 2 Dose 0   No facility-administered medications prior to visit.    Allergies  Allergen Reactions   Nickel Swelling   Sulfa Antibiotics Hives    ROS Review of Systems  Constitutional:  Negative for chills and fever.  HENT: Negative.    Eyes: Negative.   Respiratory:  Negative for shortness of breath.   Cardiovascular:   Negative for chest pain.  Gastrointestinal:  Positive for abdominal pain. Negative for nausea and vomiting.  Endocrine: Negative.   Genitourinary:  Positive for decreased urine volume, difficulty urinating, dysuria, frequency, hematuria and pelvic pain. Negative for vaginal discharge.  Musculoskeletal:  Positive for back pain.  Skin: Negative.   Allergic/Immunologic: Negative.   Neurological: Negative.   Hematological: Negative.   Psychiatric/Behavioral: Negative.       Objective:    Physical Exam Vitals and nursing note reviewed.  Constitutional:      Appearance: Normal appearance.  HENT:     Head: Normocephalic and atraumatic.     Right Ear: External ear normal.     Left Ear: External ear normal.     Nose: Nose normal.     Mouth/Throat:     Mouth: Mucous membranes are moist.     Pharynx: Oropharynx is clear.  Eyes:     Extraocular Movements: Extraocular movements intact.     Conjunctiva/sclera: Conjunctivae normal.     Pupils: Pupils are equal, round, and reactive to light.  Cardiovascular:     Rate and Rhythm: Normal rate and regular rhythm.     Pulses: Normal pulses.     Heart sounds: Normal heart sounds.  Pulmonary:     Effort: Pulmonary effort is normal.     Breath sounds: Normal breath sounds.  Abdominal:     General: Abdomen is flat.     Palpations: Abdomen is soft.     Tenderness: There is abdominal tenderness in the suprapubic area. There is right CVA tenderness and left CVA tenderness.  Musculoskeletal:        General: Normal range of motion.     Cervical back: Normal range of motion and neck supple.  Skin:    General: Skin is warm and dry.  Neurological:     General: No focal deficit present.     Mental Status: She is alert and oriented to person, place, and time.  Psychiatric:        Mood and Affect: Mood normal.        Behavior: Behavior normal.        Thought Content: Thought content normal.        Judgment: Judgment normal.    BP (!) 145/73 (BP  Location: Left Arm, Patient Position: Sitting, Cuff Size: Normal)   Pulse 85   Temp 98.5 F (36.9 C) (Oral)   Resp 16   Ht _0  (1.6 m)   Wt 128 lb (58.1 kg)   LMP 02/07/2021   SpO2 100%   BMI 22.67 kg/m  Wt Readings from Last 3 Encounters:  02/10/21 128 lb (58.1 kg)  12/16/20 130 lb 9.6 oz (59.2 kg)  09/17/20 128 lb 3.2 oz (58.2 kg)  Health Maintenance Due  Topic Date Due   PAP SMEAR-Modifier  Never done   INFLUENZA VACCINE  01/31/2021    There are no preventive care reminders to display for this patient.  Lab Results  Component Value Date   TSH 1.800 09/17/2020   Lab Results  Component Value Date   WBC 5.5 11/25/2020   HGB 14.2 11/25/2020   HCT 42.1 11/25/2020   MCV 86 11/25/2020   PLT 266 11/25/2020   Lab Results  Component Value Date   NA 138 09/17/2020   K 4.1 09/17/2020   CO2 21 09/17/2020   GLUCOSE 87 09/17/2020   BUN 21 (H) 09/17/2020   CREATININE 1.05 (H) 09/17/2020   BILITOT <0.2 09/17/2020   ALKPHOS 49 09/17/2020   AST 21 09/17/2020   ALT 14 09/17/2020   PROT 7.3 09/17/2020   ALBUMIN 4.4 09/17/2020   CALCIUM 9.4 09/17/2020   EGFR 71 09/17/2020   Lab Results  Component Value Date   CHOL 178 09/17/2020   Lab Results  Component Value Date   HDL 56 09/17/2020   Lab Results  Component Value Date   LDLCALC 105 (H) 09/17/2020   Lab Results  Component Value Date   TRIG 96 09/17/2020   Lab Results  Component Value Date   CHOLHDL 3.2 09/17/2020   Lab Results  Component Value Date   HGBA1C 5.8 (H) 09/17/2020      Assessment & Plan:   Problem List Items Addressed This Visit       Genitourinary   Acute cystitis without hematuria   Relevant Medications   cefTRIAXone (ROCEPHIN) injection 500 mg   Other Visit Diagnoses     Chronic urinary bladder pain    -  Primary   Relevant Medications   amitriptyline (ELAVIL) 10 MG tablet   Other Relevant Orders   Ambulatory referral to Urology       Meds ordered this encounter   Medications   cefTRIAXone (ROCEPHIN) injection 500 mg   amitriptyline (ELAVIL) 10 MG tablet    Sig: Take 1 tablet (10 mg total) by mouth at bedtime.    Dispense:  30 tablet    Refill:  1    Order Specific Question:   Supervising Provider    Answer:   WRIGHT, PATRICK E [[id number]]  1. Chronic urinary bladder pain Recurrent bladder pain, trial amitriptyline, refer to urology for further evaluation.  Red flags given for prompt reevaluation - Ambulatory referral to Urology - amitriptyline (ELAVIL) 10 MG tablet; Take 1 tablet (10 mg total) by mouth at bedtime.  Dispense: 30 tablet; Refill: 1  2. Acute cystitis with hematuria Treating for acute cystitis based on clinical symptoms, urine sent for culture. - cefTRIAXone (ROCEPHIN) injection 500 mg  I have reviewed the patient's medical history (PMH, PSH, Social History, Family History, Medications, and allergies) , and have been updated if relevant. I spent 31 minutes reviewing chart and  face to face time with patient.    Follow-up: Return if symptoms worsen or fail to improve.    Loraine Grip Mayers, PA-C

## 2021-02-10 NOTE — Progress Notes (Signed)
C/o bladder pain  Pain 4-8/10 intermittant Takes AZO  Request a referral

## 2021-02-12 ENCOUNTER — Emergency Department (HOSPITAL_COMMUNITY): Payer: BC Managed Care – PPO

## 2021-02-12 ENCOUNTER — Emergency Department (HOSPITAL_COMMUNITY)
Admission: EM | Admit: 2021-02-12 | Discharge: 2021-02-12 | Disposition: A | Discharge status: Home / Self Care | Payer: BC Managed Care – PPO | Attending: Emergency Medicine | Admitting: Emergency Medicine

## 2021-02-12 ENCOUNTER — Other Ambulatory Visit: Payer: Self-pay

## 2021-02-12 ENCOUNTER — Encounter (HOSPITAL_COMMUNITY): Payer: Self-pay | Admitting: Emergency Medicine

## 2021-02-12 DIAGNOSIS — R102 Pelvic and perineal pain: Secondary | ICD-10-CM

## 2021-02-12 DIAGNOSIS — B373 Candidiasis of vulva and vagina: Secondary | ICD-10-CM | POA: Diagnosis not present

## 2021-02-12 DIAGNOSIS — J45909 Unspecified asthma, uncomplicated: Secondary | ICD-10-CM | POA: Diagnosis not present

## 2021-02-12 DIAGNOSIS — Z7951 Long term (current) use of inhaled steroids: Secondary | ICD-10-CM | POA: Insufficient documentation

## 2021-02-12 DIAGNOSIS — B379 Candidiasis, unspecified: Secondary | ICD-10-CM | POA: Insufficient documentation

## 2021-02-12 LAB — URINALYSIS, ROUTINE W REFLEX MICROSCOPIC
Bacteria, UA: NONE SEEN
Bilirubin Urine: NEGATIVE
Glucose, UA: NEGATIVE mg/dL
Ketones, ur: 5 mg/dL — AB
Leukocytes,Ua: NEGATIVE
Nitrite: NEGATIVE
Protein, ur: NEGATIVE mg/dL
Specific Gravity, Urine: 1.004 — ABNORMAL LOW (ref 1.005–1.030)
pH: 7 (ref 5.0–8.0)

## 2021-02-12 LAB — WET PREP, GENITAL
Clue Cells Wet Prep HPF POC: NONE SEEN
Sperm: NONE SEEN
Trich, Wet Prep: NONE SEEN

## 2021-02-12 LAB — COMPREHENSIVE METABOLIC PANEL
ALT: 15 U/L (ref 0–44)
AST: 14 U/L — ABNORMAL LOW (ref 15–41)
Albumin: 4.3 g/dL (ref 3.5–5.0)
Alkaline Phosphatase: 37 U/L — ABNORMAL LOW (ref 38–126)
Anion gap: 8 (ref 5–15)
BUN: 13 mg/dL (ref 6–20)
CO2: 24 mmol/L (ref 22–32)
Calcium: 9.6 mg/dL (ref 8.9–10.3)
Chloride: 107 mmol/L (ref 98–111)
Creatinine, Ser: 0.85 mg/dL (ref 0.44–1.00)
GFR, Estimated: >60 mL/min (ref >60)
Glucose, Bld: 99 mg/dL (ref 70–99)
Potassium: 4 mmol/L (ref 3.5–5.1)
Sodium: 139 mmol/L (ref 135–145)
Total Bilirubin: 0.9 mg/dL (ref 0.3–1.2)
Total Protein: 7.4 g/dL (ref 6.5–8.1)

## 2021-02-12 LAB — CBC
HCT: 42.4 % (ref 36.0–46.0)
Hemoglobin: 14.3 g/dL (ref 12.0–15.0)
MCH: 30 pg (ref 26.0–34.0)
MCHC: 33.7 g/dL (ref 30.0–36.0)
MCV: 88.9 fL (ref 80.0–100.0)
Platelets: 260 10*3/uL (ref 150–400)
RBC: 4.77 MIL/uL (ref 3.87–5.11)
RDW: 12.4 % (ref 11.5–15.5)
WBC: 5.6 10*3/uL (ref 4.0–10.5)
nRBC: 0 % (ref 0.0–0.2)

## 2021-02-12 LAB — I-STAT BETA HCG BLOOD, ED (MC, WL, AP ONLY): I-stat hCG, quantitative: <5 m[IU]/mL (ref <5)

## 2021-02-12 LAB — LIPASE, BLOOD: Lipase: 30 U/L (ref 11–51)

## 2021-02-12 MED ORDER — FLUCONAZOLE 150 MG PO TABS
150.0000 mg | ORAL_TABLET | Freq: Once | ORAL | Status: AC
  Administered 2021-02-12: 150 mg via ORAL
  Filled 2021-02-12: qty 1

## 2021-02-12 MED ORDER — FLUCONAZOLE 150 MG PO TABS: ORAL_TABLET | ORAL | 0 refills | Status: DC

## 2021-02-12 NOTE — ED Provider Notes (Signed)
[facility]-EMERGENCY DEPT Provider Note   CSN: [id number] Arrival date & time: 02/12/21  1027     History Chief Complaint  Patient presents with   Abdominal Pain    Jocelyn Sawyer is a 35 y.o. female with PMHx GERD who presents to the ED today with complaints of pelvic/lower abdominal pain for the past 2 weeks.  She reports similar symptoms with dysuria over the past 6 months when she was diagnosed with 2 UTIs.  She states that this feels somewhat similar however much more severe.  She states that about 1 month ago she began having similar dysuria as well as pelvic pain.  She was seen at urgent care with a normal urinalysis.  She was prescribed naproxen for pain and advised to follow-up with OB/GYN.  She saw an OB/GYN 1 week later and had a normal Pap smear.  She does not believe she was tested for STIs or had a BV/yeast wet prep done.  She does report she is married for the past 10 years however they have not been sexually active in about a month since this started.  She denies any vaginal discharge and is not concerned about STIs however states she is open to getting checked for same today.  Reports that the OB/GYN prescribed her amitriptyline with thoughts that her symptoms could be related to interstitial cystitis.  She was advised to follow-up with urology however they are unable to see her until late September.  She states that she began having worsening pain about 2 weeks ago prompting return ED visit today.  She does report history of 3 C-sections in the past -she states that after her third C-section she had a tubal ligation however states that afterwards she began having these symptoms with UTIs.  She does report she began feeling nauseated a couple of days ago and states she has not felt like eating anything and feels like she has lost about 4 pounds in the past week.  She denies any diarrhea or vomiting.  No fevers. LNMP 08/08.   The history is provided by the  patient and medical records.      Past Medical History:  Diagnosis Date   ADHD (attention deficit hyperactivity disorder)    Asthma    GERD (gastroesophageal reflux disease)    IDA (iron deficiency anemia)     Patient Active Problem List   Diagnosis Date Noted   Anxiety and depression 09/19/2020   Iron deficiency anemia 09/18/2020   Acute cystitis without hematuria 09/03/2020   ADHD (attention deficit hyperactivity disorder) 08/06/2020   Asthma 08/06/2020   Uterine size date discrepancy pregnancy 08/06/2020   [redacted] weeks gestation of pregnancy 11/04/2019   History of prior pregnancy with IUGR newborn 10/20/2019   Epigastric pain 07/09/2018   Gastro-esophageal reflux disease without esophagitis 05/15/2018   Iron deficiency 11/15/2017   History of cesarean delivery affecting pregnancy 09/29/2015   History of uterine scar from previous surgery 09/29/2015   Indication for care in labor and delivery, antepartum 09/29/2015   Essential hypertension in pregnancy 08/26/2015    Past Surgical History:  Procedure Laterality Date   CESAREAN SECTION     x 3   TONSILLECTOMY     TUBAL LIGATION       OB History   No obstetric history on file.     Family History  Problem Relation Age of Onset   Hypertension Mother    Bipolar disorder Mother    Kidney disease Mother  Throat cancer Father        squamous cell   Breast cancer Maternal Grandmother    Leukemia Maternal Grandmother    Heart attack Maternal Grandfather    Bone cancer Paternal Grandmother    Suicidality Paternal Grandfather     Social History   Tobacco Use   Smoking status: Never   Smokeless tobacco: Never  Vaping Use   Vaping Use: Never used  Substance Use Topics   Alcohol use: Not Currently   Drug use: Never    Home Medications Prior to Admission medications   Medication Sig Start Date End Date Taking? Authorizing Provider  albuterol (VENTOLIN HFA) 108 (90 Base) MCG/ACT inhaler Inhale into the lungs.  05/23/16  Yes [provider]  amitriptyline (ELAVIL) 10 MG tablet Take 1 tablet (10 mg total) by mouth at bedtime. 02/10/21  Yes Mayers, Cari S, PA-C  amphetamine-dextroamphetamine (ADDERALL XR) 10 MG 24 hr capsule Take by mouth. 10/20/16  Yes [provider]  dexlansoprazole (DEXILANT) 60 MG capsule Take 1 capsule (60 mg total) by mouth daily. 01/17/21  Yes Hilarie Fredrickson, MD  fluconazole (DIFLUCAN) 150 MG tablet Take additional tablet on Day 4 if your symptoms persist 02/12/21  Yes Hyman Hopes, Cleora Karnik, PA-C  Probiotic Product (PROBIOTIC-10 PO) Take by mouth.   Yes [provider]  naproxen (NAPROSYN) 500 MG tablet Take 1 tablet (500 mg total) by mouth 2 (two) times daily. Patient not taking: No sig reported 01/26/21   Wieters, Hallie C, PA-C    Allergies    Nickel and Sulfa antibiotics  Review of Systems   Review of Systems  Constitutional:  Negative for chills and fever.  Gastrointestinal:  Positive for abdominal pain and nausea. Negative for constipation, diarrhea and vomiting.  Genitourinary:  Positive for dysuria and pelvic pain. Negative for flank pain and vaginal bleeding.  All other systems reviewed and are negative.  Physical Exam Updated Vital Signs BP (!) 143/82 (BP Location: Right Arm)   Pulse 88   Temp 98.4 F (36.9 C) (Oral)   Resp 16   Ht 5\' 3"  (1.6 m)   Wt 56.7 kg   LMP 02/07/2021   SpO2 99%   BMI 22.14 kg/m   Physical Exam Vitals and nursing note reviewed.  Constitutional:      Appearance: She is not ill-appearing or diaphoretic.  HENT:     Head: Normocephalic and atraumatic.  Eyes:     Conjunctiva/sclera: Conjunctivae normal.  Cardiovascular:     Rate and Rhythm: Normal rate and regular rhythm.  Pulmonary:     Effort: Pulmonary effort is normal.     Breath sounds: Normal breath sounds. No wheezing, rhonchi or rales.  Abdominal:     General: Abdomen is flat.     Palpations: Abdomen is soft.     Tenderness: There is abdominal  tenderness in the right lower quadrant, suprapubic area and left lower quadrant. There is no right CVA tenderness, left CVA tenderness, guarding or rebound.  Genitourinary:    Comments: Chaperone present for exam. No rashes, lesions, or tenderness to external genitalia. No erythema, injury, or tenderness to vaginal mucosa. Small amount of old blood in vault with thin discharge. No adnexal masses, tenderness, or fullness. Mild cervical TTP without obvious chandelier's sign. Cervical os is closed. Uterus non-deviated, mobile, nonTTP, and without enlargement.  Musculoskeletal:     Cervical back: Neck supple.  Skin:    General: Skin is warm and dry.  Neurological:     Mental Status:  She is alert.    ED Results / Procedures / Treatments   Labs (all labs ordered are listed, but only abnormal results are displayed) Labs Reviewed  WET PREP, GENITAL - Abnormal; Notable for the following components:      Result Value   Yeast Wet Prep HPF POC PRESENT (*)    WBC, Wet Prep HPF POC MODERATE (*)    All other components within normal limits  COMPREHENSIVE METABOLIC PANEL - Abnormal; Notable for the following components:   AST 14 (*)    Alkaline Phosphatase 37 (*)    All other components within normal limits  URINALYSIS, ROUTINE W REFLEX MICROSCOPIC - Abnormal; Notable for the following components:   Color, Urine STRAW (*)    Specific Gravity, Urine 1.004 (*)    Hgb urine dipstick MODERATE (*)    Ketones, ur 5 (*)    All other components within normal limits  LIPASE, BLOOD  CBC  I-STAT BETA HCG BLOOD, ED ([facility], [facility], [facility] ONLY)  GC/CHLAMYDIA PROBE AMP ([place]) NOT AT [facility]    EKG None  Radiology US PELVIC COMPLETE W TRANSVAGINAL AND TORSION R/O  Result Date: 02/12/2021 CLINICAL DATA:  Pelvic pain. EXAM: TRANSABDOMINAL AND TRANSVAGINAL ULTRASOUND OF PELVIS DOPPLER ULTRASOUND OF OVARIES TECHNIQUE: Both transabdominal and transvaginal ultrasound examinations of the pelvis were performed.  Transabdominal technique was performed for global imaging of the pelvis including uterus, ovaries, adnexal regions, and pelvic cul-de-sac. It was necessary to proceed with endovaginal exam following the transabdominal exam to visualize the endometrium and ovaries. Color and duplex Doppler ultrasound was utilized to evaluate blood flow to the ovaries. COMPARISON:  None. FINDINGS: Uterus Measurements: 8.3 x 4.1 x 5.7 cm = volume: 103.2 mL. No fibroids or other mass visualized. Endometrium Thickness: 8.0 mm.  No focal abnormality visualized. Right ovary Measurements: 3.0 x 2.2 x 2.1 cm = volume: 7.2 mL. Normal appearance/no adnexal mass. Left ovary Measurements: 3.4 x 1.7 x 2.7 cm = volume: 8.2 mL. Normal appearance/no adnexal mass. Pulsed Doppler evaluation of both ovaries demonstrates normal low-resistance arterial and venous waveforms. Other findings Trace physiologic fluid in the cul-de-sac. IMPRESSION: 1. The uterus, endometrium, and ovaries are unremarkable. No cause for the patient's pain identified. Minimal fluid in the cul-de-sac is likely physiologic. Electronically Signed   By: Gerome Sam III M.D.   On: 02/12/2021 14:28    Procedures Procedures   Medications Ordered in ED Medications  fluconazole (DIFLUCAN) tablet 150 mg (has no administration in time range)    ED Course  I have reviewed the triage vital signs and the nursing notes.  Pertinent labs & imaging results that were available during my care of the patient were reviewed by me and considered in my medical decision making (see chart for details).  Clinical Course as of 02/12/21 1438  Sat Feb 12, 2021  1134 WBC: 5.6 [MV]  1157 Yeast Wet Prep HPF POC(!): PRESENT [MV]    Clinical Course User Index [MV] Tanda Rockers, PA-C   MDM Rules/Calculators/A&P                           35 year old female who presents to the ED today with complaints of lower abdominal/pelvic pain for the past 2 weeks, worsening gradually with  complaints of dysuria as well as nausea.  Frequent UTIs in the past however this feels different.  Has had recent Pap smear without findings.  On arrival to the ED vitals are stable and  patient appears to be in no acute distress.  On exam she does have diffuse lower abdominal tenderness palpation without rebound or guarding.  We will plan for pelvic exam at this time as well as lab work.  Will await urinalysis.  May consider imaging depending on pelvic exam.  Suspicion for acute abdomen at this time given patient states the pain is mostly pelvic.  Has been being treated for interstitial cystitis with amitriptyline with only mild relief.  Plans to see urology sometime next month.  CBC withhout leukocytosis. Hgb stable at 14.3 CMP without electrolyte abnormalities Lipase negative at 30  U/A with moderate hgb (just finished her menstrual cycle) with budding yeast present. Negative leuks and nitrites.  Pelvic exam with small amount of old blood in vault and mild cervical TTP without obvious chandelier's sign to suggest PID. Mild thin discharge in vault.  Wet prep positive for yeast. Question if yeast infection could  be causing pt's pain. I did have shared decision making with patient regarding her pelvic pain/ultrasound. Pt would like to proceed with ultrasound at this time. If negative will treat for yeast infection with OBGYN follow up. Pain could also certainly be related to IC. Recommend calling [facility] to see if they can get her in sooner than 1 month from now.   Ultrasound without acute findings at this time. Will dispo at this time. Will send additional dose of diflucan to take in 3 days if she continues to have symptoms. Pt in agreement with plan and stable for discharge home.   This note was prepared using Dragon voice recognition software and may include unintentional dictation errors due to the inherent limitations of voice recognition software.   Final Clinical Impression(s) / ED  Diagnoses Final diagnoses:  Pelvic pain in female  Yeast infection    Rx / DC Orders ED Discharge Orders          Ordered    fluconazole (DIFLUCAN) 150 MG tablet        02/12/21 1434             Discharge Instructions      Your ultrasound did not show any abnormal findings at this time We have treated you with a dose of Diflucan for a yeast infection today. I have sent an additional dose to the pharmacy to take on Day 4 if your symptoms persist  I would recommend following up with both your OBGYN regarding yeast infection as well as further evaluation with [facility] if your symptoms persist despite taking the yeast infection medication as your symptoms may also be related to interstitial cystitis  We have tested you for gonorrhea and chlamydia at this time. Please await your results. We will call you if you test positive. You can return to the ED at that time or the health department for treatment.          Tanda RockersVenter, Huma Imhoff, PA-C 02/12/21 1438    Koleen DistanceWright, Anna G, MD 02/12/21 [phone]

## 2021-02-12 NOTE — ED Notes (Signed)
Patient given discharge instructions, all questions answered. Patient in possession of all belongings, directed to the discharge area  

## 2021-02-12 NOTE — ED Provider Notes (Signed)
Emergency Medicine Provider Triage Evaluation Note  Jocelyn Sawyer , a 35 y.o. female  was evaluated in triage.  Pt complains of pelvic pain.  Patient states that she made to the area about 6 months ago and initially experienced 2 UTIs.  She states that she was prescribed Macrobid which did not provide adequate relief and her UTIs were subsequently treated with IM Rocephin as well which provided relief of her symptoms.  Since then she has experienced intermittent pelvic pain as well as dysuria.  She was evaluated at urgent care recently and found to have a negative UA and urine culture so she was sent to OB/GYN about 2 to 3 weeks ago when she states that she had a reassuring pelvic exam.  Her most recent menstrual cycle started on August 8 and she states around this time she began experiencing severe pelvic pain once again with dysuria.  Denies any increase in vaginal discharge and notes having been in a monogamous relationship for the past 10 years.  Reports a surgical history to the abdomen including multiple C-sections as well as a tubal ligation.  Physical Exam  BP (!) 143/82 (BP Location: Right Arm)   Pulse 88   Temp 98.4 F (36.9 C) (Oral)   Resp 16   Ht 5\' 3"  (1.6 m)   Wt 56.7 kg   LMP 02/07/2021   SpO2 99%   BMI 22.14 kg/m  Gen:   Awake, no distress   Resp:  Normal effort  MSK:   Moves extremities without difficulty  Other:    Medical Decision Making  Medically screening exam initiated at 10:44 AM.  Appropriate orders placed.  Jocelyn Sawyer was informed that the remainder of the evaluation will be completed by another provider, this initial triage assessment does not replace that evaluation, and the importance of remaining in the ED until their evaluation is complete.   Fayne Norrie, PA-C 02/12/21 1046    02/14/21, MD 02/12/21 1322

## 2021-02-12 NOTE — ED Triage Notes (Signed)
Patient reports pelvic & bladder pain x5 days. She reports dysuria and pain. She has a history of chronic UTIs, reports urine culture on Monday was negative. Denies hematuria or vaginal discharge.

## 2021-02-12 NOTE — ED Notes (Signed)
Ultrasound being performed at bedside.

## 2021-02-12 NOTE — Discharge Instructions (Addendum)
Your ultrasound did not show any abnormal findings at this time We have treated you with a dose of Diflucan for a yeast infection today. I have sent an additional dose to the pharmacy to take on Day 4 if your symptoms persist  I would recommend following up with both your OBGYN regarding yeast infection as well as further evaluation with [facility] if your symptoms persist despite taking the yeast infection medication as your symptoms may also be related to interstitial cystitis  We have tested you for gonorrhea and chlamydia at this time. Please await your results. We will call you if you test positive. You can return to the ED at that time or the health department for treatment.

## 2021-02-14 DIAGNOSIS — R3129 Other microscopic hematuria: Secondary | ICD-10-CM | POA: Diagnosis not present

## 2021-02-14 DIAGNOSIS — R310 Gross hematuria: Secondary | ICD-10-CM | POA: Diagnosis not present

## 2021-02-14 DIAGNOSIS — R311 Benign essential microscopic hematuria: Secondary | ICD-10-CM | POA: Diagnosis not present

## 2021-02-14 DIAGNOSIS — R102 Pelvic and perineal pain: Secondary | ICD-10-CM | POA: Diagnosis not present

## 2021-02-14 DIAGNOSIS — R350 Frequency of micturition: Secondary | ICD-10-CM | POA: Diagnosis not present

## 2021-02-14 DIAGNOSIS — R300 Dysuria: Secondary | ICD-10-CM | POA: Diagnosis not present

## 2021-02-14 LAB — GC/CHLAMYDIA PROBE AMP (~~LOC~~) NOT AT ARMC
Chlamydia: NEGATIVE
Comment: NEGATIVE
Comment: NORMAL
Neisseria Gonorrhea: NEGATIVE

## 2021-02-16 LAB — URINE CULTURE

## 2021-02-17 ENCOUNTER — Telehealth: Payer: Self-pay | Admitting: *Deleted

## 2021-02-17 NOTE — Telephone Encounter (Signed)
-----   Message from Roney Jaffe, [place] sent at 02/16/2021  9:50 AM EDT ----- Please call patient and let her know that her urine culture was negative for any type of bacterial growth

## 2021-02-17 NOTE — Telephone Encounter (Signed)
Patient verified DOB Patient had no questions about results via mychart view.

## 2021-02-21 DIAGNOSIS — F900 Attention-deficit hyperactivity disorder, predominantly inattentive type: Secondary | ICD-10-CM | POA: Diagnosis not present

## 2021-02-23 ENCOUNTER — Ambulatory Visit: Payer: BC Managed Care – PPO | Admitting: Rheumatology

## 2021-02-23 DIAGNOSIS — R102 Pelvic and perineal pain: Secondary | ICD-10-CM | POA: Diagnosis not present

## 2021-02-23 DIAGNOSIS — M62838 Other muscle spasm: Secondary | ICD-10-CM | POA: Diagnosis not present

## 2021-02-23 DIAGNOSIS — R300 Dysuria: Secondary | ICD-10-CM | POA: Diagnosis not present

## 2021-02-24 NOTE — Progress Notes (Deleted)
Patient ID: Jocelyn Sawyer, female    DOB: [birth year]  MRN: [id number]  CC: Hospital Follow-Up   Subjective: Jocelyn Sawyer is a 35 y.o. female who presents for hospital follow-up.   Visit 02/12/2021 at [facility] per PA note: 35 year old female who presents to the ED today with complaints of lower abdominal/pelvic pain for the past 2 weeks, worsening gradually with complaints of dysuria as well as nausea.  Frequent UTIs in the past however this feels different.  Has had recent Pap smear without findings.  On arrival to the ED vitals are stable and patient appears to be in no acute distress.  On exam she does have diffuse lower abdominal tenderness palpation without rebound or guarding.  We will plan for pelvic exam at this time as well as lab work.  Will await urinalysis.  May consider imaging depending on pelvic exam.  Suspicion for acute abdomen at this time given patient states the pain is mostly pelvic.  Has been being treated for interstitial cystitis with amitriptyline with only mild relief.  Plans to see urology sometime next month.   CBC withhout leukocytosis. Hgb stable at 14.3 CMP without electrolyte abnormalities Lipase negative at 30   U/A with moderate hgb (just finished her menstrual cycle) with budding yeast present. Negative leuks and nitrites.  Pelvic exam with small amount of old blood in vault and mild cervical TTP without obvious chandelier's sign to suggest PID. Mild thin discharge in vault.  Wet prep positive for yeast. Question if yeast infection could  be causing pt's pain. I did have shared decision making with patient regarding her pelvic pain/ultrasound. Pt would like to proceed with ultrasound at this time. If negative will treat for yeast infection with OBGYN follow up. Pain could also certainly be related to IC. Recommend calling [facility] to see if they can get her in sooner than 1 month from now.    Ultrasound without acute findings at this  time. Will dispo at this time. Will send additional dose of diflucan to take in 3 days if she continues to have symptoms. Pt in agreement with plan and stable for discharge home.   02/25/2021: 03/11/21 Rheumatology appt  03/30/21 Gastro appt 04/19/21 GYN apppt  Uro appt? For uti   Pelvic pain  Urinary symptoms   Patient Active Problem List   Diagnosis Date Noted   Anxiety and depression 09/19/2020   Iron deficiency anemia 09/18/2020   Acute cystitis without hematuria 09/03/2020   ADHD (attention deficit hyperactivity disorder) 08/06/2020   Asthma 08/06/2020   Uterine size date discrepancy pregnancy 08/06/2020   [redacted] weeks gestation of pregnancy 11/04/2019   History of prior pregnancy with IUGR newborn 10/20/2019   Epigastric pain 07/09/2018   Gastro-esophageal reflux disease without esophagitis 05/15/2018   Iron deficiency 11/15/2017   History of cesarean delivery affecting pregnancy 09/29/2015   History of uterine scar from previous surgery 09/29/2015   Indication for care in labor and delivery, antepartum 09/29/2015   Essential hypertension in pregnancy 08/26/2015     Current Outpatient Medications on File Prior to Visit  Medication Sig Dispense Refill   albuterol (VENTOLIN HFA) 108 (90 Base) MCG/ACT inhaler Inhale into the lungs.     amitriptyline (ELAVIL) 10 MG tablet Take 1 tablet (10 mg total) by mouth at bedtime. 30 tablet 1   amphetamine-dextroamphetamine (ADDERALL XR) 10 MG 24 hr capsule Take by mouth.     dexlansoprazole (DEXILANT) 60 MG capsule Take 1 capsule (60 mg total)  by mouth daily. 90 capsule 3   fluconazole (DIFLUCAN) 150 MG tablet Take additional tablet on Day 4 if your symptoms persist 1 tablet 0   naproxen (NAPROSYN) 500 MG tablet Take 1 tablet (500 mg total) by mouth 2 (two) times daily. (Patient not taking: No sig reported) 30 tablet 0   Probiotic Product (PROBIOTIC-10 PO) Take by mouth.     No current facility-administered medications on file prior to visit.     Allergies  Allergen Reactions   Nickel Swelling   Sulfa Antibiotics Hives    Social History   Socioeconomic History   Marital status: Married    Spouse name: Not on file   Number of children: 3   Years of education: Not on file   Highest education level: Not on file  Occupational History   Occupation: Nurse  Tobacco Use   Smoking status: Never   Smokeless tobacco: Never  Vaping Use   Vaping Use: Never used  Substance and Sexual Activity   Alcohol use: Not Currently   Drug use: Never   Sexual activity: Yes    Birth control/protection: None  Other Topics Concern   Not on file  Social History Narrative   Not on file   Social Determinants of Health   Financial Resource Strain: Not on file  Food Insecurity: Not on file  Transportation Needs: Not on file  Physical Activity: Not on file  Stress: Not on file  Social Connections: Not on file  Intimate Partner Violence: Not on file    Family History  Problem Relation Age of Onset   Hypertension Mother    Bipolar disorder Mother    Kidney disease Mother    Throat cancer Father        squamous cell   Breast cancer Maternal Grandmother    Leukemia Maternal Grandmother    Heart attack Maternal Grandfather    Bone cancer Paternal Grandmother    Suicidality Paternal Grandfather     Past Surgical History:  Procedure Laterality Date   CESAREAN SECTION     x 3   TONSILLECTOMY     TUBAL LIGATION      ROS: Review of Systems Negative except as stated above  PHYSICAL EXAM: LMP 02/07/2021   Physical Exam  {female adult master:310786} {female adult master:310785}  CMP Latest Ref Rng & Units 02/12/2021 09/17/2020  Glucose 70 - 99 mg/dL 99 87  BUN 6 - 20 mg/dL 13 06(Y)  Creatinine 6.94 - 1.00 mg/dL 8.54 6.27(O)  Sodium 350 - 145 mmol/L 139 138  Potassium 3.5 - 5.1 mmol/L 4.0 4.1  Chloride 98 - 111 mmol/L 107 102  CO2 22 - 32 mmol/L 24 21  Calcium 8.9 - 10.3 mg/dL 9.6 9.4  Total Protein 6.5 - 8.1 g/dL 7.4  7.3  Total Bilirubin 0.3 - 1.2 mg/dL 0.9 <0.9  Alkaline Phos 38 - 126 U/L 37(L) 49  AST 15 - 41 U/L 14(L) 21  ALT 0 - 44 U/L 15 14   Lipid Panel     Component Value Date/Time   CHOL 178 09/17/2020 1615   TRIG 96 09/17/2020 1615   HDL 56 09/17/2020 1615   CHOLHDL 3.2 09/17/2020 1615   LDLCALC 105 (H) 09/17/2020 1615    CBC    Component Value Date/Time   WBC 5.6 02/12/2021 1114   RBC 4.77 02/12/2021 1114   HGB 14.3 02/12/2021 1114   HGB 14.2 11/25/2020 0918   HCT 42.4 02/12/2021 1114   HCT 42.1 11/25/2020 0918  PLT 260 02/12/2021 1114   PLT 266 11/25/2020 0918   MCV 88.9 02/12/2021 1114   MCV 86 11/25/2020 0918   MCH 30.0 02/12/2021 1114   MCHC 33.7 02/12/2021 1114   RDW 12.4 02/12/2021 1114   RDW 15.6 (H) 11/25/2020 0918    ASSESSMENT AND PLAN:  There are no diagnoses linked to this encounter.   Patient was given the opportunity to ask questions.  Patient verbalized understanding of the plan and was able to repeat key elements of the plan. Patient was given clear instructions to go to Emergency Department or return to medical center if symptoms don't improve, worsen, or new problems develop.The patient verbalized understanding.   No orders of the defined types were placed in this encounter.    Requested Prescriptions    No prescriptions requested or ordered in this encounter    No follow-ups on file.  Rema Fendt, NP

## 2021-02-25 ENCOUNTER — Ambulatory Visit: Payer: BC Managed Care – PPO | Admitting: Family

## 2021-02-28 NOTE — Progress Notes (Deleted)
Office Visit Note  Patient: Jocelyn Sawyer             Date of Birth: 15-Jan-1986           MRN: [id number]             PCP: Rema Fendt, NP Referring: Rema Fendt, NP Visit Date: 03/11/2021 Occupation: @GUAROCC @  Subjective:  No chief complaint on file.   History of Present Illness: Jocelyn Sawyer is a 36 y.o. female ***   Activities of Daily Living:  Patient reports morning stiffness for *** {minute/hour:19697}.   Patient {ACTIONS;DENIES/REPORTS:21021675::"Denies"} nocturnal pain.  Difficulty dressing/grooming: {ACTIONS;DENIES/REPORTS:21021675::"Denies"} Difficulty climbing stairs: {ACTIONS;DENIES/REPORTS:21021675::"Denies"} Difficulty getting out of chair: {ACTIONS;DENIES/REPORTS:21021675::"Denies"} Difficulty using hands for taps, buttons, cutlery, and/or writing: {ACTIONS;DENIES/REPORTS:21021675::"Denies"}  No Rheumatology ROS completed.   PMFS History:  Patient Active Problem List   Diagnosis Date Noted   Anxiety and depression 09/19/2020   Iron deficiency anemia 09/18/2020   Acute cystitis without hematuria 09/03/2020   ADHD (attention deficit hyperactivity disorder) 08/06/2020   Asthma 08/06/2020   Uterine size date discrepancy pregnancy 08/06/2020   [redacted] weeks gestation of pregnancy 11/04/2019   History of prior pregnancy with IUGR newborn 10/20/2019   Epigastric pain 07/09/2018   Gastro-esophageal reflux disease without esophagitis 05/15/2018   Iron deficiency 11/15/2017   History of cesarean delivery affecting pregnancy 09/29/2015   History of uterine scar from previous surgery 09/29/2015   Indication for care in labor and delivery, antepartum 09/29/2015   Essential hypertension in pregnancy 08/26/2015    Past Medical History:  Diagnosis Date   ADHD (attention deficit hyperactivity disorder)    Asthma    GERD (gastroesophageal reflux disease)    IDA (iron deficiency anemia)     Family History  Problem Relation Age of Onset   Hypertension  Mother    Bipolar disorder Mother    Kidney disease Mother    Throat cancer Father        squamous cell   Breast cancer Maternal Grandmother    Leukemia Maternal Grandmother    Heart attack Maternal Grandfather    Bone cancer Paternal Grandmother    Suicidality Paternal Grandfather    Past Surgical History:  Procedure Laterality Date   CESAREAN SECTION     x 3   TONSILLECTOMY     TUBAL LIGATION     Social History   Social History Narrative   Not on file   Immunization History  Administered Date(s) Administered   Influenza Split 04/23/2015, 04/22/2016   Influenza, Seasonal, Injecte, Preservative Fre 05/19/2019   Influenza,inj,Quad PF,6+ Mos 04/23/2015, 04/22/2016   Influenza,inj,quad, With Preservative 04/23/2017, 05/15/2018   Influenza-Unspecified 05/08/2014, 07/13/2019   Moderna Sars-Covid-2 Vaccination 07/20/2019, 08/30/2019, 07/05/2020   Tdap 10/17/2019     Objective: Vital Signs: There were no vitals taken for this visit.   Physical Exam   Musculoskeletal Exam: ***  CDAI Exam: CDAI Score: -- Patient Global: --; Provider Global: -- Swollen: --; Tender: -- Joint Exam 03/11/2021   No joint exam has been documented for this visit   There is currently no information documented on the homunculus. Go to the Rheumatology activity and complete the homunculus joint exam.  Investigation: No additional findings.  Imaging: 05/11/2021 PELVIC COMPLETE W TRANSVAGINAL AND TORSION R/O  Result Date: 02/12/2021 CLINICAL DATA:  Pelvic pain. EXAM: TRANSABDOMINAL AND TRANSVAGINAL ULTRASOUND OF PELVIS DOPPLER ULTRASOUND OF OVARIES TECHNIQUE: Both transabdominal and transvaginal ultrasound examinations of the pelvis were performed. Transabdominal technique was performed for global imaging of the  pelvis including uterus, ovaries, adnexal regions, and pelvic cul-de-sac. It was necessary to proceed with endovaginal exam following the transabdominal exam to visualize the endometrium and  ovaries. Color and duplex Doppler ultrasound was utilized to evaluate blood flow to the ovaries. COMPARISON:  None. FINDINGS: Uterus Measurements: 8.3 x 4.1 x 5.7 cm = volume: 103.2 mL. No fibroids or other mass visualized. Endometrium Thickness: 8.0 mm.  No focal abnormality visualized. Right ovary Measurements: 3.0 x 2.2 x 2.1 cm = volume: 7.2 mL. Normal appearance/no adnexal mass. Left ovary Measurements: 3.4 x 1.7 x 2.7 cm = volume: 8.2 mL. Normal appearance/no adnexal mass. Pulsed Doppler evaluation of both ovaries demonstrates normal low-resistance arterial and venous waveforms. Other findings Trace physiologic fluid in the cul-de-sac. IMPRESSION: 1. The uterus, endometrium, and ovaries are unremarkable. No cause for the patient's pain identified. Minimal fluid in the cul-de-sac is likely physiologic. Electronically Signed   By: Gerome Sam III M.D.   On: 02/12/2021 14:28    Recent Labs: Lab Results  Component Value Date   WBC 5.6 02/12/2021   HGB 14.3 02/12/2021   PLT 260 02/12/2021   NA 139 02/12/2021   K 4.0 02/12/2021   CL 107 02/12/2021   CO2 24 02/12/2021   GLUCOSE 99 02/12/2021   BUN 13 02/12/2021   CREATININE 0.85 02/12/2021   BILITOT 0.9 02/12/2021   ALKPHOS 37 (L) 02/12/2021   AST 14 (L) 02/12/2021   ALT 15 02/12/2021   PROT 7.4 02/12/2021   ALBUMIN 4.3 02/12/2021   CALCIUM 9.6 02/12/2021    Speciality Comments: No specialty comments available.  Procedures:  No procedures performed Allergies: Nickel and Sulfa antibiotics   Assessment / Plan:     Visit Diagnoses: Chronic fatigue  History of asthma  Gastro-esophageal reflux disease without esophagitis  Iron deficiency  History of uterine scar from previous surgery  History of prior pregnancy with IUGR newborn  History of cesarean delivery affecting pregnancy  Anxiety and depression  Attention deficit hyperactivity disorder (ADHD), combined type  Orders: No orders of the defined types were placed in  this encounter.  No orders of the defined types were placed in this encounter.   Face-to-face time spent with patient was *** minutes. Greater than 50% of time was spent in counseling and coordination of care.  Follow-Up Instructions: No follow-ups on file.   Gearldine Bienenstock, PA-C  Note - This record has been created using Dragon software.  Chart creation errors have been sought, but may not always  have been located. Such creation errors do not reflect on  the standard of medical care.

## 2021-03-02 DIAGNOSIS — F900 Attention-deficit hyperactivity disorder, predominantly inattentive type: Secondary | ICD-10-CM | POA: Diagnosis not present

## 2021-03-03 DIAGNOSIS — M62838 Other muscle spasm: Secondary | ICD-10-CM | POA: Diagnosis not present

## 2021-03-03 DIAGNOSIS — R300 Dysuria: Secondary | ICD-10-CM | POA: Diagnosis not present

## 2021-03-03 DIAGNOSIS — R102 Pelvic and perineal pain: Secondary | ICD-10-CM | POA: Diagnosis not present

## 2021-03-08 NOTE — Progress Notes (Signed)
Patient ID: Jocelyn Sawyer, female    DOB: 1985/07/21  MRN: [id number]  CC: Anxiety and Weight Loss  Subjective: Jocelyn Sawyer is a 35 y.o. female who presents for anxiety and weight loss.   Her concerns today include:  Reports concerns of weight loss of 8 pounds since August 2022 without intention. Decreased appetite and making herself eat most days. Concern that she is going to continue to loose weight and she would not like to do so. She is generally physically fit and active. Currently only able to get in walking as exercise related to medical conditions restricting high intensity exercise. Reports her mother was perimenopausal at her age.   Reports increased anxiety since August 2022. Reports home life going well. Enjoys being a mother and wife. Reports feeling that something bad is going to happen and not sure why. Reports shortness of breath and intermittent tingling/numbing sensation of the body. Denies chest pain, thoughts of self-harm, suicidal ideations, and homicidal ideations.   Was seen at [facility], Obstetrics/Gynecology, and Urology without clear explanation of increased anxiety and insomnia. Seen by Psychiatry and was recommended to follow-up with primary care for additional testing.  Thought increased anxiety was related to side effect of Trazodone which she was taking for insomnia. With Trazodone only able to get 1 hour of sleep each night. Once discontinued Trazodone did not see improvement of anxiety. Tried over-the-counter melatonin and only able to get about 2 hours of sleep at night. Reports she isn't sure how much longer she can go without getting enough sleep. Also, thought anxiety related to Amitriptyline and once discontinued did not see improvement. No longer taking Adderall.  Reports has self-discontinued all prescribed medications but still feeling anxious. Reports she doesn't want to continue taking medications and potentially masking a bigger  issue.    Depression screen [facility] 2/9 03/09/2021 02/10/2021 12/02/2020 09/17/2020 09/02/2020  Decreased Interest 0 0 0 1 1  Down, Depressed, Hopeless 0 1 0 2 0  PHQ - 2 Score 0 1 0 3 1  Altered sleeping - 0 0 0 0  Tired, decreased energy - 0 3 2 2   Change in appetite - 0 0 0 0  Feeling bad or failure about yourself  - 0 - 2 0  Trouble concentrating - 0 0 2 2  Moving slowly or fidgety/restless - 0 0 0 0  Suicidal thoughts - 0 0 0 0  PHQ-9 Score - 1 3 9 5   Difficult doing work/chores - - - Very difficult -     Patient Active Problem List   Diagnosis Date Noted   Anxiety and depression 09/19/2020   Iron deficiency anemia 09/18/2020   Acute cystitis without hematuria 09/03/2020   ADHD (attention deficit hyperactivity disorder) 08/06/2020   Asthma 08/06/2020   Uterine size date discrepancy pregnancy 08/06/2020   [redacted] weeks gestation of pregnancy 11/04/2019   History of prior pregnancy with IUGR newborn 10/20/2019   Epigastric pain 07/09/2018   Gastro-esophageal reflux disease without esophagitis 05/15/2018   Iron deficiency 11/15/2017   History of cesarean delivery affecting pregnancy 09/29/2015   History of uterine scar from previous surgery 09/29/2015   Indication for care in labor and delivery, antepartum 09/29/2015   Essential hypertension in pregnancy 08/26/2015     Current Outpatient Medications on File Prior to Visit  Medication Sig Dispense Refill   albuterol (VENTOLIN HFA) 108 (90 Base) MCG/ACT inhaler Inhale into the lungs.     amitriptyline (ELAVIL) 10 MG tablet  Take 1 tablet (10 mg total) by mouth at bedtime. 30 tablet 1   amphetamine-dextroamphetamine (ADDERALL XR) 10 MG 24 hr capsule Take by mouth.     dexlansoprazole (DEXILANT) 60 MG capsule Take 1 capsule (60 mg total) by mouth daily. 90 capsule 3   fluconazole (DIFLUCAN) 150 MG tablet Take additional tablet on Day 4 if your symptoms persist 1 tablet 0   naproxen (NAPROSYN) 500 MG tablet Take 1 tablet (500 mg total) by  mouth 2 (two) times daily. (Patient not taking: No sig reported) 30 tablet 0   Probiotic Product (PROBIOTIC-10 PO) Take by mouth.     No current facility-administered medications on file prior to visit.    Allergies  Allergen Reactions   Nickel Swelling   Sulfa Antibiotics Hives    Social History   Socioeconomic History   Marital status: Married    Spouse name: Not on file   Number of children: 3   Years of education: Not on file   Highest education level: Not on file  Occupational History   Occupation: Nurse  Tobacco Use   Smoking status: Never   Smokeless tobacco: Never  Vaping Use   Vaping Use: Never used  Substance and Sexual Activity   Alcohol use: Not Currently   Drug use: Never   Sexual activity: Yes    Birth control/protection: None  Other Topics Concern   Not on file  Social History Narrative   Not on file   Social Determinants of Health   Financial Resource Strain: Not on file  Food Insecurity: Not on file  Transportation Needs: Not on file  Physical Activity: Not on file  Stress: Not on file  Social Connections: Not on file  Intimate Partner Violence: Not on file    Family History  Problem Relation Age of Onset   Hypertension Mother    Bipolar disorder Mother    Kidney disease Mother    Throat cancer Father        squamous cell   Breast cancer Maternal Grandmother    Leukemia Maternal Grandmother    Heart attack Maternal Grandfather    Bone cancer Paternal Grandmother    Suicidality Paternal Grandfather     Past Surgical History:  Procedure Laterality Date   CESAREAN SECTION     x 3   TONSILLECTOMY     TUBAL LIGATION      ROS: Review of Systems Negative except as stated above  PHYSICAL EXAM: BP 123/84 (BP Location: Left Arm, Patient Position: Sitting, Cuff Size: Normal)   Pulse 88   Temp 97.9 F (36.6 C)   Resp 18   Ht 5' 2.99" (1.6 m)   Wt 117 lb 3.2 oz (53.2 kg)   LMP 02/07/2021   SpO2 96%   BMI 20.77 kg/m   Physical  Exam HENT:     Head: Normocephalic and atraumatic.  Eyes:     Extraocular Movements: Extraocular movements intact.     Conjunctiva/sclera: Conjunctivae normal.     Pupils: Pupils are equal, round, and reactive to light.  Cardiovascular:     Rate and Rhythm: Normal rate and regular rhythm.     Pulses: Normal pulses.     Heart sounds: Normal heart sounds.  Pulmonary:     Effort: Pulmonary effort is normal.     Breath sounds: Normal breath sounds.  Musculoskeletal:     Cervical back: Normal range of motion and neck supple.  Neurological:     General: No focal deficit present.  Mental Status: She is alert and oriented to person, place, and time.  Psychiatric:        Mood and Affect: Mood is anxious.   ASSESSMENT AND PLAN: 1. Anxiety state: - Patient denies thoughts of self-harm, suicidal ideations, and homicidal ideations.  - Encouraged to keep appointments with Psychiatry.  - Will obtain baseline panel of labs for further evaluation.  - Diagnostic chest x-ray for further evaluation. - Follow-up with primary provider as scheduled.  - TSH - [id number] - Follicle stimulating hormone - Luteinizing hormone - Vitamin D, 25-hydroxy - Hemoglobin A1c - ANA - C3 and C4 - Lupus anticoagulant - DG Chest 2 View; Future - POCT URINALYSIS DIP (CLINITEK); Future - CBC  2. Insomnia, unspecified type: - Referral to Sleep Studies for further evaluation and management.  - Ambulatory referral to Sleep Studies  3. Loss of weight: - Patient currently normal weight and normal BMI for height. - Referral to Medical Weight Management for further evaluation and management.  - Amb Ref to Medical Weight Management   Patient was given the opportunity to ask questions.  Patient verbalized understanding of the plan and was able to repeat key elements of the plan. Patient was given clear instructions to go to Emergency Department or return to medical center if symptoms don't improve, worsen, or new  problems develop.The patient verbalized understanding.   Orders Placed This Encounter  Procedures   DG Chest 2 View   TSH   [id number]   Follicle stimulating hormone   Luteinizing hormone   Vitamin D, 25-hydroxy   Hemoglobin A1c   ANA   C3 and C4   Lupus anticoagulant   CBC   Ambulatory referral to Sleep Studies   Amb Ref to Medical Weight Management   POCT URINALYSIS DIP (CLINITEK)     Requested Prescriptions    No prescriptions requested or ordered in this encounter    Follow-up with primary provider as scheduled.   Camillia Herter, NP

## 2021-03-09 ENCOUNTER — Other Ambulatory Visit: Payer: Self-pay

## 2021-03-09 ENCOUNTER — Encounter: Payer: Self-pay | Admitting: Family

## 2021-03-09 ENCOUNTER — Ambulatory Visit: Payer: BC Managed Care – PPO

## 2021-03-09 ENCOUNTER — Ambulatory Visit (INDEPENDENT_AMBULATORY_CARE_PROVIDER_SITE_OTHER): Payer: BC Managed Care – PPO | Admitting: Family

## 2021-03-09 VITALS — BP 123/84 | HR 88 | Temp 97.9°F | Resp 18 | Ht 62.99 in | Wt 117.2 lb

## 2021-03-09 DIAGNOSIS — G4700 Insomnia, unspecified: Secondary | ICD-10-CM | POA: Diagnosis not present

## 2021-03-09 DIAGNOSIS — F411 Generalized anxiety disorder: Secondary | ICD-10-CM

## 2021-03-09 DIAGNOSIS — R634 Abnormal weight loss: Secondary | ICD-10-CM | POA: Diagnosis not present

## 2021-03-09 NOTE — Progress Notes (Signed)
Pt presents for weight loss and insomnia reports been going on since August 8th

## 2021-03-11 ENCOUNTER — Ambulatory Visit: Payer: BC Managed Care – PPO | Admitting: Rheumatology

## 2021-03-11 ENCOUNTER — Encounter: Payer: Self-pay | Admitting: Family

## 2021-03-11 DIAGNOSIS — Z8709 Personal history of other diseases of the respiratory system: Secondary | ICD-10-CM

## 2021-03-11 DIAGNOSIS — K219 Gastro-esophageal reflux disease without esophagitis: Secondary | ICD-10-CM

## 2021-03-11 DIAGNOSIS — E611 Iron deficiency: Secondary | ICD-10-CM

## 2021-03-11 DIAGNOSIS — O34219 Maternal care for unspecified type scar from previous cesarean delivery: Secondary | ICD-10-CM

## 2021-03-11 DIAGNOSIS — R5382 Chronic fatigue, unspecified: Secondary | ICD-10-CM

## 2021-03-11 DIAGNOSIS — F902 Attention-deficit hyperactivity disorder, combined type: Secondary | ICD-10-CM

## 2021-03-11 DIAGNOSIS — Z98891 History of uterine scar from previous surgery: Secondary | ICD-10-CM

## 2021-03-11 DIAGNOSIS — Z8759 Personal history of other complications of pregnancy, childbirth and the puerperium: Secondary | ICD-10-CM

## 2021-03-11 DIAGNOSIS — F32A Depression, unspecified: Secondary | ICD-10-CM

## 2021-03-13 ENCOUNTER — Encounter: Payer: Self-pay | Admitting: Family

## 2021-03-13 LAB — C3 AND C4
Complement C3, Serum: 118 mg/dL (ref 82–167)
Complement C4, Serum: 29 mg/dL (ref 12–38)

## 2021-03-13 LAB — CMP14+EGFR
ALT: 20 IU/L (ref 0–32)
AST: 19 IU/L (ref 0–40)
Albumin/Globulin Ratio: 2 (ref 1.2–2.2)
Albumin: 5.1 g/dL — ABNORMAL HIGH (ref 3.8–4.8)
Alkaline Phosphatase: 58 IU/L (ref 44–121)
BUN/Creatinine Ratio: 16 (ref 9–23)
BUN: 16 mg/dL (ref 6–20)
Bilirubin Total: 0.4 mg/dL (ref 0.0–1.2)
CO2: 19 mmol/L — ABNORMAL LOW (ref 20–29)
Calcium: 10 mg/dL (ref 8.7–10.2)
Chloride: 100 mmol/L (ref 96–106)
Creatinine, Ser: 0.98 mg/dL (ref 0.57–1.00)
Globulin, Total: 2.6 g/dL (ref 1.5–4.5)
Glucose: 81 mg/dL (ref 65–99)
Potassium: 4.1 mmol/L (ref 3.5–5.2)
Sodium: 139 mmol/L (ref 134–144)
Total Protein: 7.7 g/dL (ref 6.0–8.5)
eGFR: 77 mL/min/{1.73_m2} (ref >59)

## 2021-03-13 LAB — SPECIMEN STATUS REPORT

## 2021-03-13 LAB — LUPUS ANTICOAGULANT

## 2021-03-13 LAB — CBC
Hematocrit: 45.4 % (ref 34.0–46.6)
Hemoglobin: 15.8 g/dL (ref 11.1–15.9)
MCH: 30.4 pg (ref 26.6–33.0)
MCHC: 34.8 g/dL (ref 31.5–35.7)
MCV: 87 fL (ref 79–97)
Platelets: 294 10*3/uL (ref 150–450)
RBC: 5.2 x10E6/uL (ref 3.77–5.28)
RDW: 12.5 % (ref 11.7–15.4)
WBC: 8.8 10*3/uL (ref 3.4–10.8)

## 2021-03-13 LAB — FOLLICLE STIMULATING HORMONE: FSH: 7.9 m[IU]/mL

## 2021-03-13 LAB — VITAMIN D 25 HYDROXY (VIT D DEFICIENCY, FRACTURES): Vit D, 25-Hydroxy: 44.8 ng/mL (ref 30.0–100.0)

## 2021-03-13 LAB — ANA: Anti Nuclear Antibody (ANA): NEGATIVE

## 2021-03-13 LAB — HEMOGLOBIN A1C
Est. average glucose Bld gHb Est-mCnc: 105 mg/dL
Hgb A1c MFr Bld: 5.3 % (ref 4.8–5.6)

## 2021-03-13 LAB — TSH: TSH: 1.03 u[IU]/mL (ref 0.450–4.500)

## 2021-03-13 LAB — LUTEINIZING HORMONE: LH: 5.4 m[IU]/mL

## 2021-03-13 NOTE — Progress Notes (Signed)
Kidney function normal.   Liver function normal.   Thyroid function normal.   Follicle stimulating hormone and luteinizing hormone normal.   ANA, C3, and C4 normal.   Vitamin D normal.   No diabetes.   No anemia.   Per [facility] we will need to recollect lupus panel. Patient encouraged to call our office and schedule a lab only appointment when best for her.

## 2021-03-15 DIAGNOSIS — F419 Anxiety disorder, unspecified: Secondary | ICD-10-CM | POA: Diagnosis not present

## 2021-03-15 DIAGNOSIS — F900 Attention-deficit hyperactivity disorder, predominantly inattentive type: Secondary | ICD-10-CM | POA: Diagnosis not present

## 2021-03-25 DIAGNOSIS — M9905 Segmental and somatic dysfunction of pelvic region: Secondary | ICD-10-CM | POA: Diagnosis not present

## 2021-03-30 ENCOUNTER — Ambulatory Visit: Payer: BC Managed Care – PPO | Admitting: Internal Medicine

## 2021-03-31 DIAGNOSIS — F419 Anxiety disorder, unspecified: Secondary | ICD-10-CM | POA: Diagnosis not present

## 2021-03-31 DIAGNOSIS — R102 Pelvic and perineal pain: Secondary | ICD-10-CM | POA: Diagnosis not present

## 2021-03-31 DIAGNOSIS — F900 Attention-deficit hyperactivity disorder, predominantly inattentive type: Secondary | ICD-10-CM | POA: Diagnosis not present

## 2021-03-31 DIAGNOSIS — R350 Frequency of micturition: Secondary | ICD-10-CM | POA: Diagnosis not present

## 2021-03-31 DIAGNOSIS — M62838 Other muscle spasm: Secondary | ICD-10-CM | POA: Diagnosis not present

## 2021-04-07 DIAGNOSIS — M62838 Other muscle spasm: Secondary | ICD-10-CM | POA: Diagnosis not present

## 2021-04-07 DIAGNOSIS — R102 Pelvic and perineal pain: Secondary | ICD-10-CM | POA: Diagnosis not present

## 2021-04-07 DIAGNOSIS — R300 Dysuria: Secondary | ICD-10-CM | POA: Diagnosis not present

## 2021-04-14 DIAGNOSIS — M62838 Other muscle spasm: Secondary | ICD-10-CM | POA: Diagnosis not present

## 2021-04-14 DIAGNOSIS — R102 Pelvic and perineal pain: Secondary | ICD-10-CM | POA: Diagnosis not present

## 2021-04-14 DIAGNOSIS — R300 Dysuria: Secondary | ICD-10-CM | POA: Diagnosis not present

## 2021-04-19 ENCOUNTER — Ambulatory Visit: Payer: BC Managed Care – PPO | Admitting: Obstetrics and Gynecology

## 2021-04-19 DIAGNOSIS — F419 Anxiety disorder, unspecified: Secondary | ICD-10-CM | POA: Diagnosis not present

## 2021-04-19 DIAGNOSIS — F900 Attention-deficit hyperactivity disorder, predominantly inattentive type: Secondary | ICD-10-CM | POA: Diagnosis not present

## 2021-04-21 ENCOUNTER — Ambulatory Visit: Payer: BC Managed Care – PPO | Admitting: Obstetrics and Gynecology

## 2021-04-21 DIAGNOSIS — M62838 Other muscle spasm: Secondary | ICD-10-CM | POA: Diagnosis not present

## 2021-04-21 DIAGNOSIS — F900 Attention-deficit hyperactivity disorder, predominantly inattentive type: Secondary | ICD-10-CM | POA: Diagnosis not present

## 2021-04-21 DIAGNOSIS — R102 Pelvic and perineal pain: Secondary | ICD-10-CM | POA: Diagnosis not present

## 2021-04-21 DIAGNOSIS — R300 Dysuria: Secondary | ICD-10-CM | POA: Diagnosis not present

## 2021-04-21 DIAGNOSIS — F419 Anxiety disorder, unspecified: Secondary | ICD-10-CM | POA: Diagnosis not present

## 2021-04-28 DIAGNOSIS — M62838 Other muscle spasm: Secondary | ICD-10-CM | POA: Diagnosis not present

## 2021-04-28 DIAGNOSIS — R102 Pelvic and perineal pain: Secondary | ICD-10-CM | POA: Diagnosis not present

## 2021-04-28 DIAGNOSIS — R8271 Bacteriuria: Secondary | ICD-10-CM | POA: Diagnosis not present

## 2021-04-28 DIAGNOSIS — R350 Frequency of micturition: Secondary | ICD-10-CM | POA: Diagnosis not present

## 2021-04-29 NOTE — Progress Notes (Signed)
[facility] New Patient Evaluation and Consultation  Referring Provider: Rema Fendt, NP PCP: Rema Fendt, NP Date of Service: 05/02/2021  SUBJECTIVE Chief Complaint: New Patient (Initial Visit)  History of Present Illness: Jocelyn Sawyer is a 35 y.o. White or Caucasian female presenting for evaluation of pelvic floor dysfunction.    Urinary Symptoms: Does not leak urine.   Day time voids 6-8.  Nocturia: 1-2 times per night to void. Voiding dysfunction: she empties her bladder well.  does not use a catheter to empty bladder.  When urinating, she feels she has no difficulties  In Feb had a UTI and did telehealth and was treated. Still had symptoms but culture was not positive. Got shot of rocephin and it cleared symptoms.  Three months later, had same symptoms. Had negative culture.  Was placed on amitriptyline for possible IC. But this made her sick and she lost weight and was taken off.  Was given a prescription for vaginal valium but didn't like the way it made her feel. Has done pelvic floor PT at [facility] since Sept. Has been doing some internal and external release. Has been doing pelvic floor stretching at home and has used a pelvic wand.  UTIs: 4 UTI's in the last year.  Currently on macrobid for UTI after intercourse last week. All symptoms started after intercourse. Still has some urethral burning and irritation.  Denies history of blood in urine and kidney or bladder stones  Urine cultures: 02/10/21- 50000 strep viridans 01/26/21- no growth  Pelvic Organ Prolapse Symptoms:                  She Denies a feeling of a bulge the vaginal area.  Bowel Symptom: Bowel movements: 1 time(s) per day Stool consistency: soft  Straining: no.  Splinting: no.  Incomplete evacuation: no.  She Denies accidental bowel leakage / fecal incontinence Bowel regimen: none  Sexual Function Sexually active: yes.  Pain with sex: Yes, deep in the  pelvis  Pelvic Pain Admits to pelvic pain with uti symptoms. Occasional burning and abdominal pain . See above.     Past Medical History:  Past Medical History:  Diagnosis Date   ADHD (attention deficit hyperactivity disorder)    Asthma    GERD (gastroesophageal reflux disease)    IDA (iron deficiency anemia)      Past Surgical History:   Past Surgical History:  Procedure Laterality Date   CESAREAN SECTION     x 3   TONSILLECTOMY     TUBAL LIGATION       Past OB/GYN History: OB History     Gravida  3   Para      Term      Preterm      AB      Living  3      SAB      IAB      Ectopic      Multiple      Live Births  3           Vaginal deliveries: 0,  Forceps/ Vacuum deliveries: 0, Cesarean section: 3  Patient's last menstrual period was 04/20/2021. Contraception: tubal ligation. Last pap smear was 02/04/21- neg.  Any history of abnormal pap smears: no.   Medications: She has a current medication list which includes the following prescription(s): albuterol, dexlansoprazole, doxycycline, probiotic product, and amphetamine-dextroamphetamine.   Allergies: Patient is allergic to nickel and sulfa antibiotics.   Social History:  Social  History   Tobacco Use   Smoking status: Never   Smokeless tobacco: Never  Vaping Use   Vaping Use: Never used  Substance Use Topics   Alcohol use: Not Currently   Drug use: Never    Relationship status: married She lives with husband.   She is not employed. Regular exercise: Yes:   History of abuse: No  Family History:   Family History  Problem Relation Age of Onset   Hypertension Mother    Bipolar disorder Mother    Kidney disease Mother    Throat cancer Father        squamous cell   Breast cancer Maternal Grandmother    Leukemia Maternal Grandmother    Heart attack Maternal Grandfather    Bone cancer Paternal Grandmother    Suicidality Paternal Grandfather      Review of Systems: Review of  Systems  Constitutional:  Negative for fever, malaise/fatigue and weight loss.  Respiratory:  Negative for cough, shortness of breath and wheezing.   Cardiovascular:  Negative for chest pain, palpitations and leg swelling.  Gastrointestinal:  Negative for abdominal pain and blood in stool.  Genitourinary:  Negative for dysuria.  Musculoskeletal:  Negative for myalgias.  Skin:  Negative for rash.  Neurological:  Negative for dizziness and headaches.  Endo/Heme/Allergies:  Does not bruise/bleed easily.  Psychiatric/Behavioral:  Negative for depression. The patient is not nervous/anxious.     OBJECTIVE Physical Exam: Vitals:   05/02/21 1418  BP: 124/74  Pulse: 87  Weight: 117 lb (53.1 kg)  Height: 5\' 3"  (1.6 m)    Physical Exam Constitutional:      General: She is not in acute distress. Pulmonary:     Effort: Pulmonary effort is normal.  Abdominal:     General: There is no distension.     Palpations: Abdomen is soft.     Tenderness: There is no abdominal tenderness. There is no rebound.  Musculoskeletal:        General: No swelling. Normal range of motion.  Skin:    General: Skin is warm and dry.     Findings: No rash.  Neurological:     Mental Status: She is alert and oriented to person, place, and time.  Psychiatric:        Mood and Affect: Mood normal.        Behavior: Behavior normal.     GU / Detailed Urogynecologic Evaluation:  Pelvic Exam: Normal external female genitalia; Bartholin's and Skene's glands normal in appearance; urethral meatus normal in appearance, no urethral masses or discharge.   CST: negative   Speculum exam reveals normal vaginal mucosa without atrophy. Cervix normal appearance. Uterus normal size, fundal tenderness present. Adnexa no mass, fullness, tenderness.    Pelvic floor strength II/V  Pelvic floor musculature: Right levator tender, Right obturator non-tender, Left levator tender, Left obturator non-tender. Mild spasm present.    POP-Q:   POP-Q  -2.5                                            Aa   -2.5                                           Ba  -7  C   2                                            Gh  3                                            Pb  9                                            tvl   -3                                            Ap  -3                                            Bp  -8                                              D     Rectal Exam:  Normal external rectum  Post-Void Residual (PVR) by Bladder Scan: In order to evaluate bladder emptying, we discussed obtaining a postvoid residual and she agreed to this procedure.  Procedure: The ultrasound unit was placed on the patient's abdomen in the suprapubic region after the patient had voided. A PVR of 33 ml was obtained by bladder scan.  Laboratory Results: POC urine: neg (currently on macrobid)   ASSESSMENT AND PLAN Ms. Robak is a 35 y.o. with:  1. Endometritis   2. Urinary frequency   3. Levator spasm   4. Dyspareunia in female    Endometritis/ Dyspareunia - negative GC/Chlamdia and pelvic US on 02/10/21 but continues to have fundal tenderness on bimanual exam.  - Will treat with doxycycline 100mg  BID x 14 days.  - This may be contributing to dyspareunia as well.   2. Urinary frequency/ urgency - She will try to obtain recent cultures and bring them to next visit.  - if she is having UTIs after intercourse, can consider post-coital prophylaxis.   3. Levator spasm - mild levator spasm on exam but had more discomfort on bimanual exam - she would like a referral for another physical therapist- pelvic PT referral at [facility] placed - can consider trigger point injection as well. Did not respond well to vaginal valium.   Return 1 month  [facility], MD   Time spent: I spent 45 minutes dedicated to the care of this patient on the date of this encounter to  include pre-visit review of records, face-to-face time with the patient and post visit documentation and ordering medication/ testing.

## 2021-05-02 ENCOUNTER — Other Ambulatory Visit: Payer: Self-pay

## 2021-05-02 ENCOUNTER — Ambulatory Visit (INDEPENDENT_AMBULATORY_CARE_PROVIDER_SITE_OTHER): Payer: BC Managed Care – PPO | Admitting: Obstetrics and Gynecology

## 2021-05-02 ENCOUNTER — Encounter: Payer: Self-pay | Admitting: Obstetrics and Gynecology

## 2021-05-02 VITALS — BP 124/74 | HR 87 | Ht 63.0 in | Wt 117.0 lb

## 2021-05-02 DIAGNOSIS — M62838 Other muscle spasm: Secondary | ICD-10-CM

## 2021-05-02 DIAGNOSIS — N719 Inflammatory disease of uterus, unspecified: Secondary | ICD-10-CM

## 2021-05-02 DIAGNOSIS — N941 Unspecified dyspareunia: Secondary | ICD-10-CM | POA: Diagnosis not present

## 2021-05-02 DIAGNOSIS — R350 Frequency of micturition: Secondary | ICD-10-CM | POA: Diagnosis not present

## 2021-05-02 LAB — POCT URINALYSIS DIPSTICK
Appearance: ABNORMAL
Bilirubin, UA: NEGATIVE
Blood, UA: NEGATIVE
Glucose, UA: NEGATIVE
Ketones, UA: POSITIVE
Leukocytes, UA: NEGATIVE
Nitrite, UA: NEGATIVE
Protein, UA: NEGATIVE
Spec Grav, UA: 1.02 (ref 1.010–1.025)
Urobilinogen, UA: 0.2 E.U./dL
pH, UA: 7 (ref 5.0–8.0)

## 2021-05-02 MED ORDER — DOXYCYCLINE HYCLATE 50 MG PO CAPS: 50.0000 mg | ORAL_CAPSULE | Freq: Two times a day (BID) | ORAL | 0 refills | Status: AC

## 2021-05-05 ENCOUNTER — Encounter: Payer: Self-pay | Admitting: Family

## 2021-05-05 ENCOUNTER — Other Ambulatory Visit: Payer: Self-pay | Admitting: Family

## 2021-05-05 DIAGNOSIS — R102 Pelvic and perineal pain: Secondary | ICD-10-CM | POA: Diagnosis not present

## 2021-05-05 DIAGNOSIS — R300 Dysuria: Secondary | ICD-10-CM | POA: Diagnosis not present

## 2021-05-05 DIAGNOSIS — M62838 Other muscle spasm: Secondary | ICD-10-CM | POA: Diagnosis not present

## 2021-05-05 DIAGNOSIS — M6289 Other specified disorders of muscle: Secondary | ICD-10-CM

## 2021-05-05 DIAGNOSIS — F419 Anxiety disorder, unspecified: Secondary | ICD-10-CM | POA: Diagnosis not present

## 2021-05-06 ENCOUNTER — Ambulatory Visit: Payer: BC Managed Care – PPO | Admitting: Internal Medicine

## 2021-05-12 DIAGNOSIS — R350 Frequency of micturition: Secondary | ICD-10-CM | POA: Diagnosis not present

## 2021-05-12 DIAGNOSIS — M62838 Other muscle spasm: Secondary | ICD-10-CM | POA: Diagnosis not present

## 2021-05-12 DIAGNOSIS — R102 Pelvic and perineal pain: Secondary | ICD-10-CM | POA: Diagnosis not present

## 2021-05-17 ENCOUNTER — Other Ambulatory Visit: Payer: Self-pay | Admitting: Family

## 2021-05-17 DIAGNOSIS — M6289 Other specified disorders of muscle: Secondary | ICD-10-CM

## 2021-05-19 DIAGNOSIS — M6281 Muscle weakness (generalized): Secondary | ICD-10-CM | POA: Diagnosis not present

## 2021-05-19 DIAGNOSIS — M62838 Other muscle spasm: Secondary | ICD-10-CM | POA: Diagnosis not present

## 2021-05-19 DIAGNOSIS — R102 Pelvic and perineal pain: Secondary | ICD-10-CM | POA: Diagnosis not present

## 2021-05-19 DIAGNOSIS — M6289 Other specified disorders of muscle: Secondary | ICD-10-CM | POA: Diagnosis not present

## 2021-05-19 DIAGNOSIS — F419 Anxiety disorder, unspecified: Secondary | ICD-10-CM | POA: Diagnosis not present

## 2021-05-24 DIAGNOSIS — M6281 Muscle weakness (generalized): Secondary | ICD-10-CM | POA: Diagnosis not present

## 2021-05-24 DIAGNOSIS — R102 Pelvic and perineal pain: Secondary | ICD-10-CM | POA: Diagnosis not present

## 2021-05-24 DIAGNOSIS — M6289 Other specified disorders of muscle: Secondary | ICD-10-CM | POA: Diagnosis not present

## 2021-05-24 DIAGNOSIS — M62838 Other muscle spasm: Secondary | ICD-10-CM | POA: Diagnosis not present

## 2021-05-30 ENCOUNTER — Institutional Professional Consult (permissible substitution): Payer: BC Managed Care – PPO | Admitting: Neurology

## 2021-06-02 DIAGNOSIS — R102 Pelvic and perineal pain: Secondary | ICD-10-CM | POA: Diagnosis not present

## 2021-06-02 DIAGNOSIS — M62838 Other muscle spasm: Secondary | ICD-10-CM | POA: Diagnosis not present

## 2021-06-02 DIAGNOSIS — M6289 Other specified disorders of muscle: Secondary | ICD-10-CM | POA: Diagnosis not present

## 2021-06-03 ENCOUNTER — Encounter: Payer: Self-pay | Admitting: Obstetrics and Gynecology

## 2021-06-03 ENCOUNTER — Other Ambulatory Visit: Payer: Self-pay

## 2021-06-03 ENCOUNTER — Ambulatory Visit (INDEPENDENT_AMBULATORY_CARE_PROVIDER_SITE_OTHER): Payer: BC Managed Care – PPO | Admitting: Obstetrics and Gynecology

## 2021-06-03 VITALS — BP 125/83 | HR 76 | Wt 120.0 lb

## 2021-06-03 DIAGNOSIS — N719 Inflammatory disease of uterus, unspecified: Secondary | ICD-10-CM | POA: Diagnosis not present

## 2021-06-03 DIAGNOSIS — N941 Unspecified dyspareunia: Secondary | ICD-10-CM

## 2021-06-03 NOTE — Progress Notes (Signed)
 Urogynecology Return Visit  SUBJECTIVE  History of Present Illness: Jocelyn Sawyer is a 35 y.o. female seen in follow-up for endometritis and dyspareunia. Plan at last visit was to start doxycycline.   Pain seems to have improved but has not yet re-attempted intercourse. Symptoms are getting better with physical therapy. Pain is worse with ovulation.   Past Medical History: Patient  has a past medical history of ADHD (attention deficit hyperactivity disorder), Asthma, GERD (gastroesophageal reflux disease), and IDA (iron deficiency anemia).   Past Surgical History: She  has a past surgical history that includes Cesarean section; Tonsillectomy; and Tubal ligation.   Medications: She has a current medication list which includes the following prescription(s): albuterol, amphetamine-dextroamphetamine, dexlansoprazole, and probiotic product.   Allergies: Patient is allergic to nickel and sulfa antibiotics.   Social History: Patient  reports that she has never smoked. She has never used smokeless tobacco. She reports that she does not currently use alcohol. She reports that she does not use drugs.      OBJECTIVE     Physical Exam: Vitals:   06/03/21 1504  BP: 125/83  Pulse: 76  Weight: 120 lb (54.4 kg)   Gen: No apparent distress, A&O x 3.  Detailed Urogynecologic Evaluation:  Deferred.    ASSESSMENT AND PLAN    Jocelyn Sawyer is a 35 y.o. with:  1. Endometritis   2. Dyspareunia in female    - We reviewed options for pelvic floor muscle spasm including oral muscle relaxants and trigger point injections.  - Since symptoms have improved, she would like to continue with PT and does not want medications at this time.   Will return as needed  Jocelyn Beards, MD  Time spent: I spent 20 minutes dedicated to the care of this patient on the date of this encounter to include pre-visit review of records, face-to-face time with the patient and post visit  documentation.

## 2021-06-09 DIAGNOSIS — R102 Pelvic and perineal pain: Secondary | ICD-10-CM | POA: Diagnosis not present

## 2021-06-09 DIAGNOSIS — M62838 Other muscle spasm: Secondary | ICD-10-CM | POA: Diagnosis not present

## 2021-06-09 DIAGNOSIS — M6289 Other specified disorders of muscle: Secondary | ICD-10-CM | POA: Diagnosis not present

## 2021-06-09 DIAGNOSIS — M6281 Muscle weakness (generalized): Secondary | ICD-10-CM | POA: Diagnosis not present

## 2021-06-16 ENCOUNTER — Ambulatory Visit: Payer: BC Managed Care – PPO | Admitting: Internal Medicine

## 2021-06-16 DIAGNOSIS — R102 Pelvic and perineal pain: Secondary | ICD-10-CM | POA: Diagnosis not present

## 2021-06-16 DIAGNOSIS — M6289 Other specified disorders of muscle: Secondary | ICD-10-CM | POA: Diagnosis not present

## 2021-06-16 DIAGNOSIS — M6281 Muscle weakness (generalized): Secondary | ICD-10-CM | POA: Diagnosis not present

## 2021-06-16 DIAGNOSIS — M62838 Other muscle spasm: Secondary | ICD-10-CM | POA: Diagnosis not present

## 2021-06-21 DIAGNOSIS — M6289 Other specified disorders of muscle: Secondary | ICD-10-CM | POA: Diagnosis not present

## 2021-06-21 DIAGNOSIS — R102 Pelvic and perineal pain: Secondary | ICD-10-CM | POA: Diagnosis not present

## 2021-06-21 DIAGNOSIS — M6281 Muscle weakness (generalized): Secondary | ICD-10-CM | POA: Diagnosis not present

## 2021-06-21 DIAGNOSIS — M62838 Other muscle spasm: Secondary | ICD-10-CM | POA: Diagnosis not present

## 2021-07-06 ENCOUNTER — Encounter: Payer: Self-pay | Admitting: Family

## 2021-07-07 NOTE — Telephone Encounter (Signed)
Spoke w/ Irving Burton at [facility] stated that their ofc never recvd referral, requested that order be faxed to them. Order faxed to [phone]

## 2021-07-13 DIAGNOSIS — R3982 Chronic bladder pain: Secondary | ICD-10-CM | POA: Diagnosis not present

## 2021-07-13 DIAGNOSIS — M6289 Other specified disorders of muscle: Secondary | ICD-10-CM | POA: Diagnosis not present

## 2021-07-13 DIAGNOSIS — R278 Other lack of coordination: Secondary | ICD-10-CM | POA: Diagnosis not present

## 2021-07-25 DIAGNOSIS — F419 Anxiety disorder, unspecified: Secondary | ICD-10-CM | POA: Diagnosis not present

## 2021-07-25 DIAGNOSIS — F900 Attention-deficit hyperactivity disorder, predominantly inattentive type: Secondary | ICD-10-CM | POA: Diagnosis not present

## 2021-07-26 ENCOUNTER — Ambulatory Visit (INDEPENDENT_AMBULATORY_CARE_PROVIDER_SITE_OTHER): Payer: BC Managed Care – PPO | Admitting: Internal Medicine

## 2021-07-26 ENCOUNTER — Encounter: Payer: Self-pay | Admitting: Internal Medicine

## 2021-07-26 VITALS — BP 104/68 | HR 68 | Ht 63.0 in | Wt 130.0 lb

## 2021-07-26 DIAGNOSIS — K219 Gastro-esophageal reflux disease without esophagitis: Secondary | ICD-10-CM | POA: Diagnosis not present

## 2021-07-26 MED ORDER — PANTOPRAZOLE SODIUM 40 MG PO TBEC: 40.0000 mg | DELAYED_RELEASE_TABLET | Freq: Every day | ORAL | 3 refills | Status: DC

## 2021-07-26 NOTE — Progress Notes (Signed)
HISTORY OF PRESENT ILLNESS:  Jocelyn Sawyer is a 36 y.o. female mother of 3 who was last seen in the office February 2022 after recently relocating to [place] with her husband for his job with [employer], with chronic GERD who establish care at that time.  She also requested a medication refill.  At that time "patient reports problems with classic reflux symptoms since college.  Initially treated with over-the-counter H2 receptor antagonist therapy and PPIs.  This helped.  Symptoms worsened over time.  Seen by GI physician in [facility] area November 2019.  Symptoms refractory to omeprazole and pantoprazole.  Underwent upper endoscopy (reviewed) January 2020.  Esophagus was normal.  Small lateral hernia.  Mild nonspecific antral erythema.  Otherwise normal.  She was placed on Dexilant 60 mg daily.  This results in excellent control of reflux symptoms.  Off PPI symptoms will recur within a few days to a week at which time she has severe epigastric burning, decreased appetite, and intolerance to foods.  On medication she does well with minimal food intolerance.  She denies dysphagia.  She has completed her COVID vaccination series and booster"  Today she presents for follow-up.  She recently schedule an office evaluation she was having problems with anorexia and weight loss after being placed on amitriptyline for possible interstitial cystitis.  This was subsequently discontinued and her GI symptoms resolved.  She tells me that she was diagnosed with pelvic floor dysfunction subsequently and is now undergoing physical therapy.  Her main issue today is wanting to get off of PPI or have an alternative PPI.  She has been taking Dexilant with good success, clinically.  However, this is quite costly.  She had been on omeprazole and pantoprazole remotely but seem to have breakthrough symptoms.  She wonders if these medications might be more effective now, given her dietary and other lifestyle changes.  She  denies dysphagia.  GI review of systems is otherwise negative.  Review of blood work from September 2022 shows unremarkable comprehensive metabolic panel and normal CBC with hemoglobin 15.8.  REVIEW OF SYSTEMS:  All non-GI ROS negative unless otherwise stated in the HPI except for urinary frequency, menstrual cramps  Past Medical History:  Diagnosis Date   ADHD (attention deficit hyperactivity disorder)    Asthma    GERD (gastroesophageal reflux disease)    IDA (iron deficiency anemia)     Past Surgical History:  Procedure Laterality Date   CESAREAN SECTION     x 3   TONSILLECTOMY     TUBAL LIGATION      Social History Jocelyn Sawyer  reports that she has never smoked. She has never used smokeless tobacco. She reports that she does not currently use alcohol. She reports that she does not use drugs.  family history includes Bipolar disorder in her mother; Bone cancer in her paternal grandmother; Breast cancer in her maternal grandmother; Heart attack in her maternal grandfather; Hypertension in her mother; Kidney disease in her mother; Leukemia in her maternal grandmother; Suicidality in her paternal grandfather; Throat cancer in her father.  Allergies  Allergen Reactions   Nickel Swelling   Sulfa Antibiotics Hives       PHYSICAL EXAMINATION: Vital signs: BP 104/68    Pulse 68    Ht 5\' 3"  (1.6 m)    Wt 130 lb (59 kg)    SpO2 99%    BMI 23.03 kg/m   Constitutional: generally well-appearing, no acute distress Psychiatric: alert and oriented x3, cooperative Eyes:  extraocular movements intact, anicteric, conjunctiva pink Mouth: oral pharynx moist, no lesions Neck: supple no lymphadenopathy Cardiovascular: heart regular rate and rhythm, no murmur Lungs: clear to auscultation bilaterally Abdomen: soft, nontender, nondistended, no obvious ascites, no peritoneal signs, normal bowel sounds, no organomegaly Rectal: Omitted Extremities: no clubbing, cyanosis, or lower extremity  edema bilaterally Skin: no lesions on visible extremities Neuro: No focal deficits.  Cranial nerves intact  ASSESSMENT:  1.  Chronic GERD.  Normal EGD elsewhere.  Requires PPI to control symptoms.  Wanting to try alternative to Dexilant.   PLAN:  1.  Prescribe pantoprazole 40 mg daily.  Medication risks reviewed 2.  Reflux precautions 3.  Routine office follow-up in 1 year.  Contact the office in the interim for any questions or problems.

## 2021-07-26 NOTE — Patient Instructions (Signed)
If you are age 37 or older, your body mass index should be between 23-30. Your Body mass index is 23.03 kg/m. If this is out of the aforementioned range listed, please consider follow up with your Primary Care Provider.  If you are age 90 or younger, your body mass index should be between 19-25. Your Body mass index is 23.03 kg/m. If this is out of the aformentioned range listed, please consider follow up with your Primary Care Provider.   ________________________________________________________  The [facility] providers would like to encourage you to use [facility] to communicate with providers for non-urgent requests or questions.  Due to long hold times on the telephone, sending your provider a message by [facility] may be a faster and more efficient way to get a response.  Please allow 48 business hours for a response.  Please remember that this is for non-urgent requests.  _______________________________________________________  We have sent the following medications to your pharmacy for you to pick up at your convenience:  Pantoprazole.  Please follow up in a year

## 2021-08-02 DIAGNOSIS — D1801 Hemangioma of skin and subcutaneous tissue: Secondary | ICD-10-CM | POA: Diagnosis not present

## 2021-08-03 ENCOUNTER — Telehealth: Payer: Self-pay | Admitting: Internal Medicine

## 2021-08-03 NOTE — Telephone Encounter (Signed)
Patients pharmacy called and is seeking advice if patient is taking both Dexliant and Protonix. Please advise.    Contact Number [phone]  Reference Number [id number]

## 2021-08-04 NOTE — Telephone Encounter (Signed)
Spoke with pharmacy and clarified that patient is not taking Dexilant - she has been switched to Pantoprazole

## 2021-08-05 ENCOUNTER — Telehealth: Payer: Self-pay

## 2021-08-05 ENCOUNTER — Encounter: Payer: Self-pay | Admitting: Internal Medicine

## 2021-08-05 MED ORDER — DEXLANSOPRAZOLE 60 MG PO CPDR: 60.0000 mg | DELAYED_RELEASE_CAPSULE | Freq: Every day | ORAL | 3 refills | Status: DC

## 2021-08-05 NOTE — Telephone Encounter (Signed)
[facility] and put a note in the comments that patient was discontinuing protonix and wanted to restart Dexilant.

## 2021-08-08 DIAGNOSIS — R102 Pelvic and perineal pain: Secondary | ICD-10-CM | POA: Diagnosis not present

## 2021-08-08 DIAGNOSIS — R3989 Other symptoms and signs involving the genitourinary system: Secondary | ICD-10-CM | POA: Diagnosis not present

## 2021-09-22 DIAGNOSIS — R3989 Other symptoms and signs involving the genitourinary system: Secondary | ICD-10-CM | POA: Diagnosis not present

## 2021-09-22 DIAGNOSIS — M6289 Other specified disorders of muscle: Secondary | ICD-10-CM | POA: Diagnosis not present

## 2021-09-22 DIAGNOSIS — N941 Unspecified dyspareunia: Secondary | ICD-10-CM | POA: Diagnosis not present

## 2021-09-22 DIAGNOSIS — R102 Pelvic and perineal pain: Secondary | ICD-10-CM | POA: Diagnosis not present

## 2021-10-17 DIAGNOSIS — F900 Attention-deficit hyperactivity disorder, predominantly inattentive type: Secondary | ICD-10-CM | POA: Diagnosis not present

## 2021-10-17 DIAGNOSIS — F419 Anxiety disorder, unspecified: Secondary | ICD-10-CM | POA: Diagnosis not present

## 2021-11-15 ENCOUNTER — Encounter: Payer: Self-pay | Admitting: Internal Medicine

## 2021-11-16 ENCOUNTER — Other Ambulatory Visit (HOSPITAL_COMMUNITY): Payer: Self-pay

## 2021-11-16 ENCOUNTER — Telehealth: Payer: Self-pay | Admitting: Pharmacy Technician

## 2021-11-16 ENCOUNTER — Telehealth: Payer: Self-pay

## 2021-11-16 MED ORDER — DEXLANSOPRAZOLE 60 MG PO CPDR: 60.0000 mg | DELAYED_RELEASE_CAPSULE | Freq: Every day | ORAL | 3 refills | Status: DC

## 2021-11-16 NOTE — Telephone Encounter (Signed)
Dexilant sent to [facility] ?

## 2021-11-16 NOTE — Telephone Encounter (Signed)
Patient Advocate Encounter ? ?Received notification from [employer] that prior authorization for DEXLANSOPRAZOLE 60MG  is required. ?  ?PA submitted on 5.17.23 ?Key [id number] ?Status is pending ?  ?[facility] will continue to follow ? ?Taiyo Kozma R Huberta Tompkins, CPhT ?Patient Advocate ?Phone: [phone] ? ?

## 2021-11-17 ENCOUNTER — Other Ambulatory Visit (HOSPITAL_COMMUNITY): Payer: Self-pay

## 2021-11-17 NOTE — Telephone Encounter (Signed)
Received notification from [facility] regarding a prior authorization for DEXLANSOPRAZOLE 60MG . Authorization has been APPROVED from 4.17.23 to 5.16.2024.   Per test claim, copay for 30 days supply is $30.20   Authorization # CaseId:[id number]

## 2022-01-09 DIAGNOSIS — F419 Anxiety disorder, unspecified: Secondary | ICD-10-CM | POA: Diagnosis not present

## 2022-01-09 DIAGNOSIS — F900 Attention-deficit hyperactivity disorder, predominantly inattentive type: Secondary | ICD-10-CM | POA: Diagnosis not present

## 2022-01-24 NOTE — Telephone Encounter (Signed)
error 

## 2022-04-02 ENCOUNTER — Other Ambulatory Visit: Payer: Self-pay | Admitting: Family

## 2022-04-02 DIAGNOSIS — J45909 Unspecified asthma, uncomplicated: Secondary | ICD-10-CM

## 2022-04-04 ENCOUNTER — Other Ambulatory Visit: Payer: Self-pay | Admitting: Family

## 2022-04-05 ENCOUNTER — Other Ambulatory Visit: Payer: Self-pay

## 2022-04-05 MED ORDER — ALBUTEROL SULFATE HFA 108 (90 BASE) MCG/ACT IN AERS
1.0000 | INHALATION_SPRAY | Freq: Four times a day (QID) | RESPIRATORY_TRACT | 2 refills | Status: DC | PRN
  Filled 2022-04-05: qty 6.7, 25d supply, fill #0

## 2022-04-05 NOTE — Telephone Encounter (Signed)
Order complete. 

## 2022-04-11 ENCOUNTER — Other Ambulatory Visit: Payer: Self-pay

## 2022-04-18 DIAGNOSIS — F900 Attention-deficit hyperactivity disorder, predominantly inattentive type: Secondary | ICD-10-CM | POA: Diagnosis not present

## 2022-04-18 DIAGNOSIS — F419 Anxiety disorder, unspecified: Secondary | ICD-10-CM | POA: Diagnosis not present

## 2022-07-24 DIAGNOSIS — F419 Anxiety disorder, unspecified: Secondary | ICD-10-CM | POA: Diagnosis not present

## 2022-07-24 DIAGNOSIS — F900 Attention-deficit hyperactivity disorder, predominantly inattentive type: Secondary | ICD-10-CM | POA: Diagnosis not present

## 2022-08-22 DIAGNOSIS — D1801 Hemangioma of skin and subcutaneous tissue: Secondary | ICD-10-CM | POA: Diagnosis not present

## 2022-08-22 DIAGNOSIS — L858 Other specified epidermal thickening: Secondary | ICD-10-CM | POA: Diagnosis not present

## 2022-10-10 DIAGNOSIS — F419 Anxiety disorder, unspecified: Secondary | ICD-10-CM | POA: Diagnosis not present

## 2022-10-10 DIAGNOSIS — F900 Attention-deficit hyperactivity disorder, predominantly inattentive type: Secondary | ICD-10-CM | POA: Diagnosis not present

## 2022-11-05 ENCOUNTER — Other Ambulatory Visit: Payer: Self-pay | Admitting: Internal Medicine

## 2023-01-10 DIAGNOSIS — F419 Anxiety disorder, unspecified: Secondary | ICD-10-CM | POA: Diagnosis not present

## 2023-01-10 DIAGNOSIS — F900 Attention-deficit hyperactivity disorder, predominantly inattentive type: Secondary | ICD-10-CM | POA: Diagnosis not present

## 2023-01-29 ENCOUNTER — Other Ambulatory Visit: Payer: Self-pay | Admitting: Internal Medicine

## 2023-01-29 MED ORDER — DEXLANSOPRAZOLE 60 MG PO CPDR: 1.0000 | DELAYED_RELEASE_CAPSULE | Freq: Every day | ORAL | 0 refills | Status: DC

## 2023-02-01 ENCOUNTER — Telehealth: Payer: Self-pay | Admitting: Pharmacy Technician

## 2023-02-01 ENCOUNTER — Other Ambulatory Visit (HOSPITAL_COMMUNITY): Payer: Self-pay

## 2023-02-01 NOTE — Telephone Encounter (Signed)
Pharmacy Patient Advocate Encounter   Received notification from [employer] that prior authorization for DEXLANSOPRAZOLE 60MG  is required/requested.   Insurance verification completed.   The patient is insured through [employer] .   Per test claim: PA required; PA submitted to [facility] via [employer] Key/confirmation #/EOC [id number] Status is pending

## 2023-02-01 NOTE — Telephone Encounter (Signed)
Pharmacy Patient Advocate Encounter  Received notification from [facility] that Prior Authorization for DEXLANSOPRAZOLE 60MG  has been APPROVED from 7.2.24 to 8.1.25. Ran test claim, Copay is $ALREADY FILLED AT OTHER PHARMACY ON 7.29.24  PA #/Case ID/Reference #: [id number]

## 2023-02-13 ENCOUNTER — Telehealth: Payer: Self-pay | Admitting: Internal Medicine

## 2023-02-13 MED ORDER — DEXLANSOPRAZOLE 60 MG PO CPDR: 1.0000 | DELAYED_RELEASE_CAPSULE | Freq: Every day | ORAL | 3 refills | Status: DC

## 2023-02-13 NOTE — Telephone Encounter (Signed)
Refilled Dexilant 

## 2023-02-13 NOTE — Telephone Encounter (Signed)
Patient called to schedule an OV to receive refills for her Dexilant.  Her appointment is scheduled for 04/25/23 at 11:00 a.m. with [facility]. She says that at this time she thinks she may have about a month.  Can you please call her in enough to last her until that time? Thank you.

## 2023-04-09 DIAGNOSIS — F419 Anxiety disorder, unspecified: Secondary | ICD-10-CM | POA: Diagnosis not present

## 2023-04-09 DIAGNOSIS — F900 Attention-deficit hyperactivity disorder, predominantly inattentive type: Secondary | ICD-10-CM | POA: Diagnosis not present

## 2023-04-25 ENCOUNTER — Ambulatory Visit (INDEPENDENT_AMBULATORY_CARE_PROVIDER_SITE_OTHER): Payer: BC Managed Care – PPO | Admitting: Gastroenterology

## 2023-04-25 ENCOUNTER — Encounter: Payer: Self-pay | Admitting: Gastroenterology

## 2023-04-25 VITALS — BP 126/70 | HR 76 | Ht 63.0 in | Wt 131.4 lb

## 2023-04-25 DIAGNOSIS — K219 Gastro-esophageal reflux disease without esophagitis: Secondary | ICD-10-CM | POA: Diagnosis not present

## 2023-04-25 MED ORDER — DEXLANSOPRAZOLE 30 MG PO CPDR: 30.0000 mg | DELAYED_RELEASE_CAPSULE | Freq: Every day | ORAL | 3 refills | Status: DC

## 2023-04-25 NOTE — Patient Instructions (Signed)
We have sent the following medications to your pharmacy for you to pick up at your convenience: Dexilant 30 mg daily.  _______________________________________________________  If your blood pressure at your visit was 140/90 or greater, please contact your primary care physician to follow up on this.  _______________________________________________________  If you are age 38 or older, your body mass index should be between 23-30. Your Body mass index is 23.27 kg/m. If this is out of the aforementioned range listed, please consider follow up with your Primary Care Provider.  If you are age 27 or younger, your body mass index should be between 19-25. Your Body mass index is 23.27 kg/m. If this is out of the aformentioned range listed, please consider follow up with your Primary Care Provider.   ________________________________________________________  The [place] [facility] providers would like to encourage you to use [facility] to communicate with providers for non-urgent requests or questions.  Due to long hold times on the telephone, sending your provider a message by [facility] may be a faster and more efficient way to get a response.  Please allow 48 business hours for a response.  Please remember that this is for non-urgent requests.  _______________________________________________________

## 2023-04-25 NOTE — Progress Notes (Signed)
04/25/2023 Neomie Carreon [id number] 1985-10-15   HISTORY OF PRESENT ILLNESS: This is a 37 year old female who is a patient of Dr. Lamar Sprinkles.  She has been seen by him twice, last in January 2023.  Has acid reflux and overall is well-controlled on Dexilant 60 mg daily.  She has tried other PPIs in the past, but they have not been as effective.  She needs refills of her medication.  No complaints.  Is asking about long-term use of the PPI therapy.  Underwent upper endoscopy January 2020 in the [place] area.  Esophagus was normal.  Small hiatal hernia.  Mild nonspecific antral erythema.  Otherwise normal.   Past Medical History:  Diagnosis Date   ADHD (attention deficit hyperactivity disorder)    Asthma    GERD (gastroesophageal reflux disease)    IDA (iron deficiency anemia)    Past Surgical History:  Procedure Laterality Date   CESAREAN SECTION     x 3   TONSILLECTOMY     TUBAL LIGATION      reports that she has never smoked. She has never used smokeless tobacco. She reports that she does not currently use alcohol. She reports that she does not use drugs. family history includes Bipolar disorder in her mother; Bone cancer in her paternal grandmother; Breast cancer in her maternal grandmother; Heart attack in her maternal grandfather; Hypertension in her mother; Kidney disease in her mother; Leukemia in her maternal grandmother; Suicidality in her paternal grandfather; Throat cancer in her father. Allergies  Allergen Reactions   Nickel Swelling   Sulfa Antibiotics Hives      Outpatient Encounter Medications as of 04/25/2023  Medication Sig   albuterol (VENTOLIN HFA) 108 (90 Base) MCG/ACT inhaler Inhale 1-2 puffs into the lungs every 6 (six) hours as needed.   amphetamine-dextroamphetamine (ADDERALL XR) 10 MG 24 hr capsule Take by mouth.   dexlansoprazole (DEXILANT) 60 MG capsule Take 1 capsule (60 mg total) by mouth daily. Patient needs follow up appointment for future  refills. Please call [phone] to schedule an appointment.   Probiotic Product (PROBIOTIC-10 PO) Take by mouth.   No facility-administered encounter medications on file as of 04/25/2023.     REVIEW OF SYSTEMS  : All other systems reviewed and negative except where noted in the History of Present Illness.   PHYSICAL EXAM: BP 126/70   Pulse 76   Ht 5\' 3"  (1.6 m)   Wt 131 lb 6 oz (59.6 kg)   SpO2 100%   BMI 23.27 kg/m  General: Well developed white female in no acute distress Head: Normocephalic and atraumatic Eyes:  Sclerae anicteric, conjunctiva pink. Ears: Normal auditory acuity Lungs: Clear throughout to auscultation Heart: Regular rate and rhythm Abdomen: Soft, nontender, non distended. No masses or hepatomegaly noted. Normal bowel sounds Musculoskeletal: Symmetrical with no gross deformities  Skin: No lesions on visible extremities Extremities: No edema  Neurological: Alert oriented x 4, grossly nonfocal Psychological:  Alert and cooperative. Normal mood and affect  ASSESSMENT AND PLAN: *Chronic GERD: Well-controlled on Dexilant 60 mg daily.  She has tried other PPIs in the past with recurrence of symptoms.  She is asking about long-term use.  We could try to decrease the Dexilant to 30 mg daily to see if that would still be effective at the lowest dose possible.  Prescription sent to pharmacy.  She will contact us back if that is not as effective and she would like prescription for the 60 mg instead.  Otherwise if she  is doing well she can follow-up in 2 years and sooner if needed.   CC:  Rema Fendt, NP

## 2023-04-25 NOTE — Progress Notes (Signed)
Noted  

## 2023-06-18 DIAGNOSIS — F900 Attention-deficit hyperactivity disorder, predominantly inattentive type: Secondary | ICD-10-CM | POA: Diagnosis not present

## 2023-06-18 DIAGNOSIS — F419 Anxiety disorder, unspecified: Secondary | ICD-10-CM | POA: Diagnosis not present

## 2023-07-21 ENCOUNTER — Other Ambulatory Visit: Payer: Self-pay | Admitting: Internal Medicine

## 2023-08-04 IMAGING — US US PELVIS COMPLETE TRANSABD/TRANSVAG W DUPLEX
1 series · 15 of 25 positions shown · non-contrast
Comparison: None.

CLINICAL DATA: Pelvic pain.

EXAM:
TRANSABDOMINAL AND TRANSVAGINAL ULTRASOUND OF PELVIS
DOPPLER ULTRASOUND OF OVARIES
TECHNIQUE: Both transabdominal and transvaginal ultrasound examinations of the
pelvis were performed. Transabdominal technique was performed for
global imaging of the pelvis including uterus, ovaries, adnexal
regions, and pelvic cul-de-sac.
It was necessary to proceed with endovaginal exam following the
transabdominal exam to visualize the endometrium and ovaries. Color
and duplex Doppler ultrasound was utilized to evaluate blood flow to
the ovaries.

[Series 1: us transvaginal non-ob mc & wl · 15 of 125 slices shown]
[im 1/125]
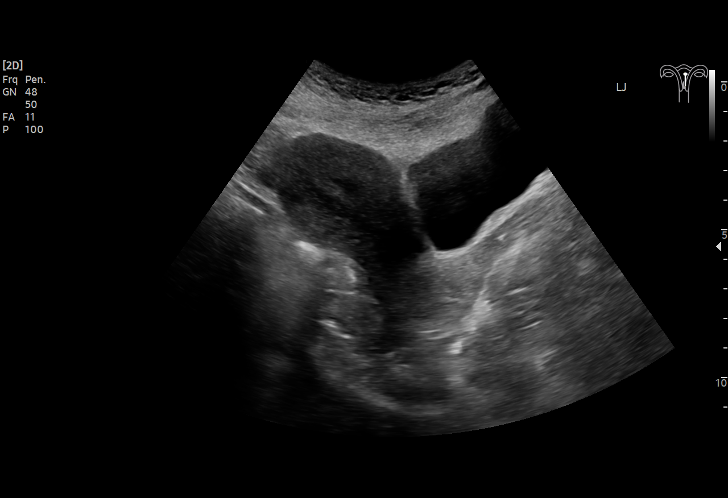
[im 11/125]
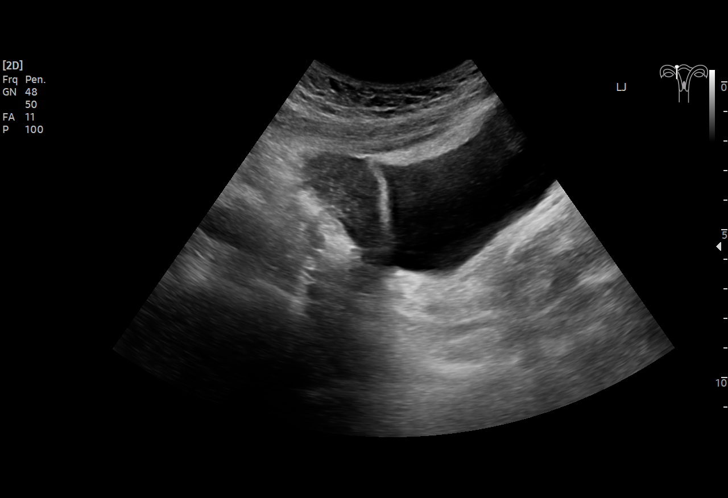
[im 21/125]
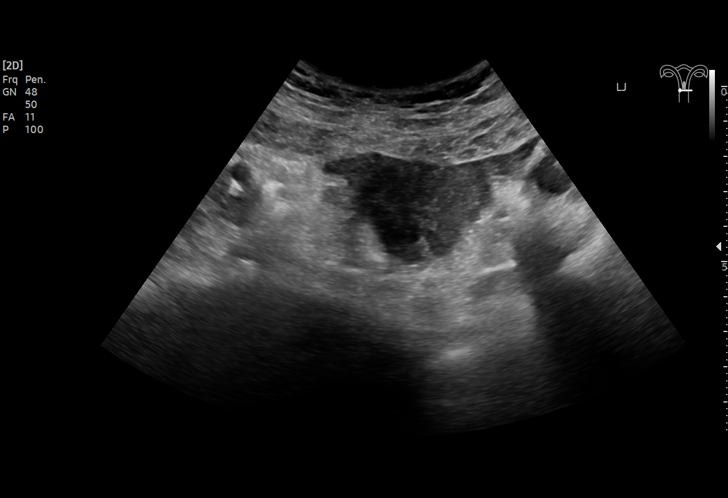
[im 26/125]
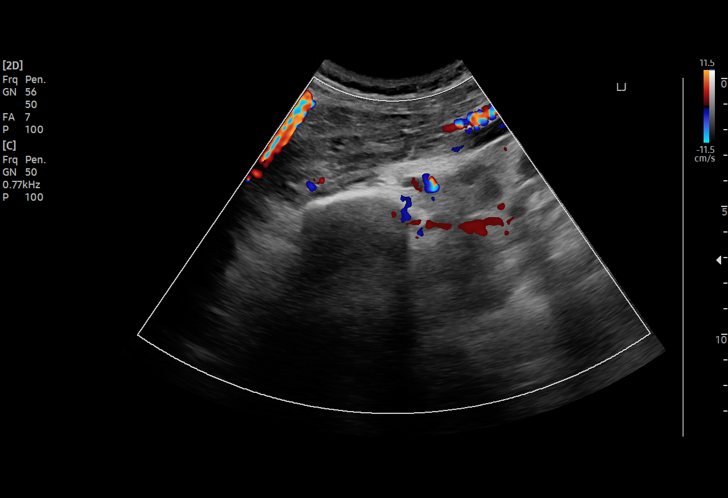
[im 37/125]
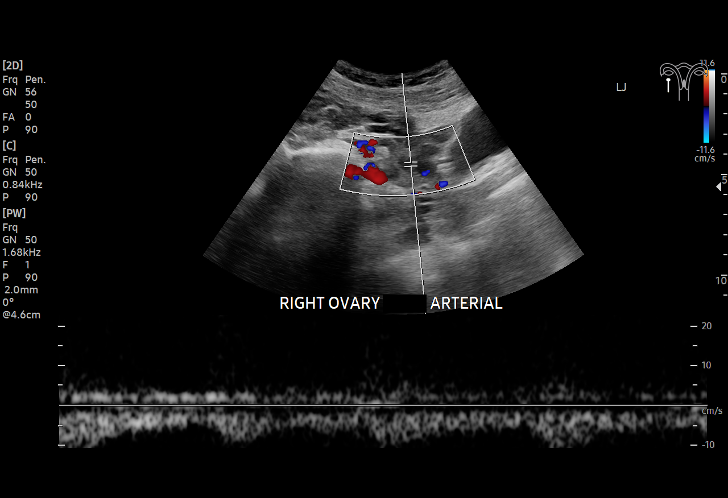
[im 47/125]
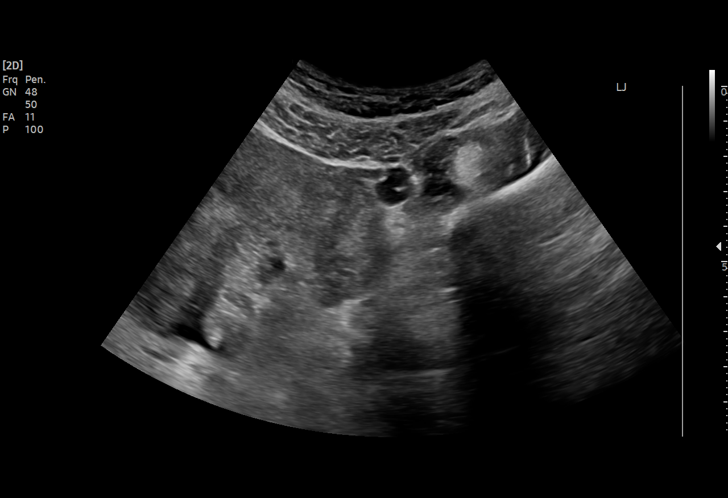
[im 52/125]
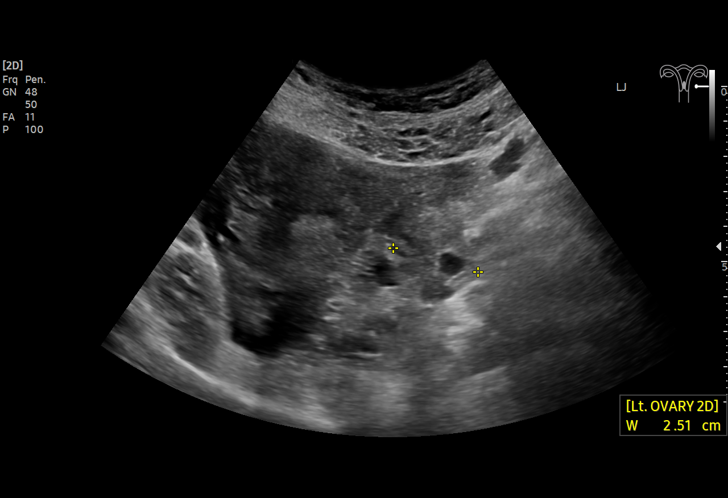
[im 63/125]
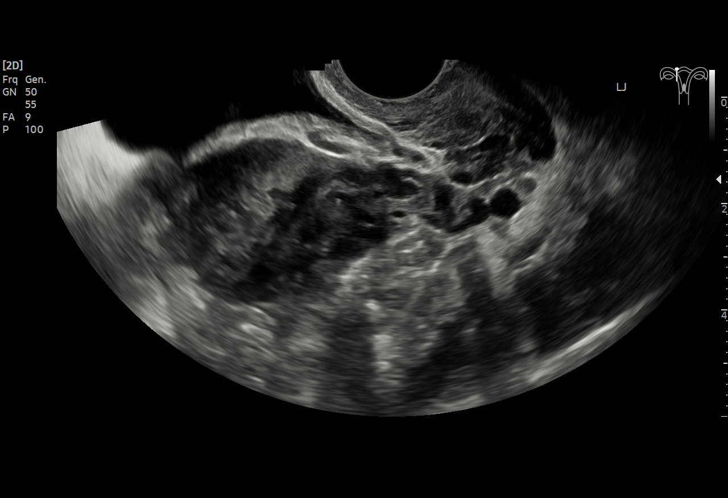
[im 73/125]
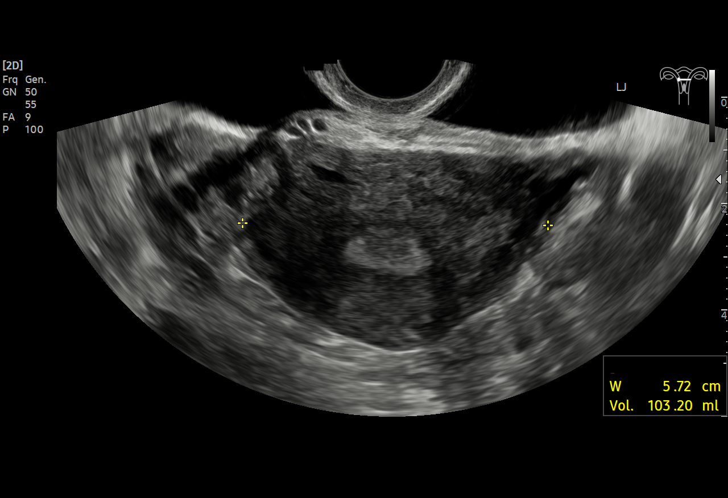
[im 78/125]
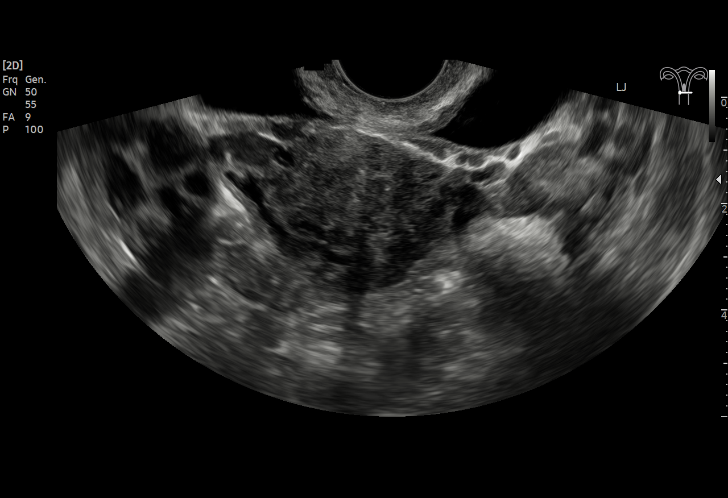
[im 88/125]
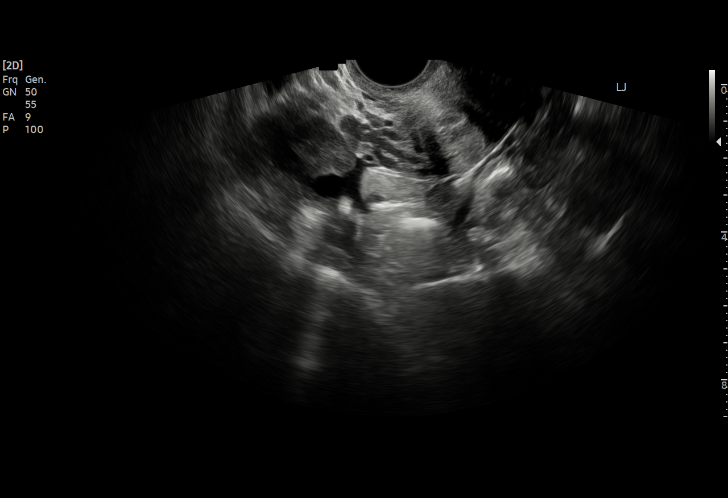
[im 99/125]
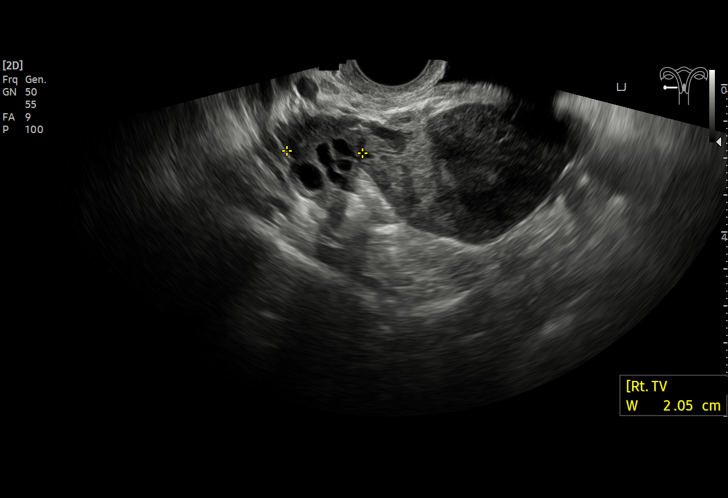
[im 104/125]
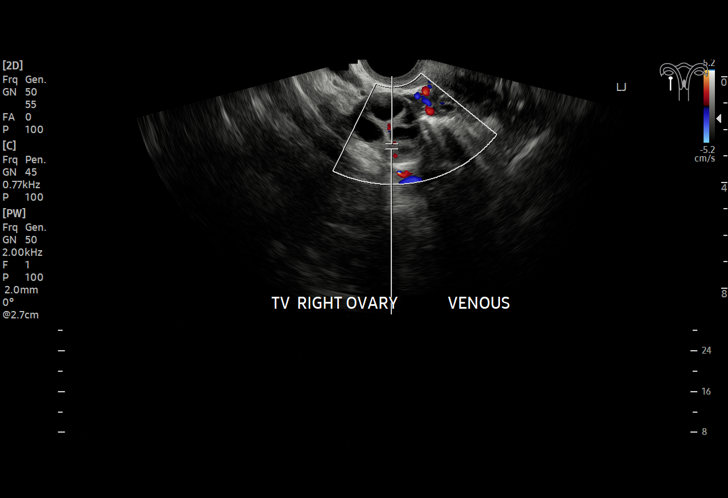
[im 114/125]
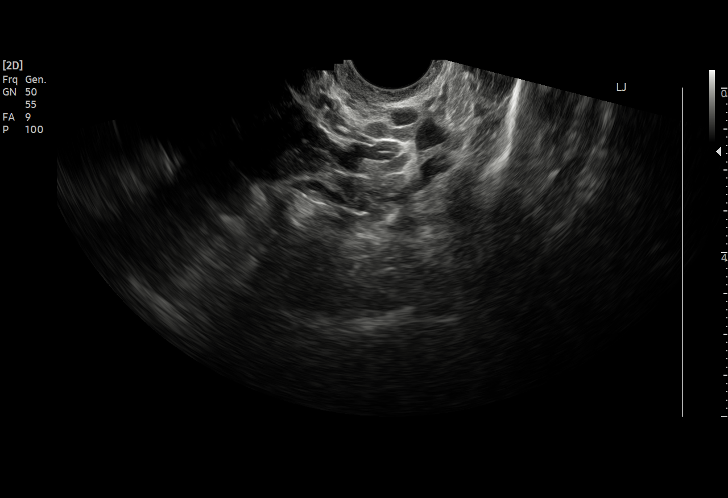
[im 125/125]
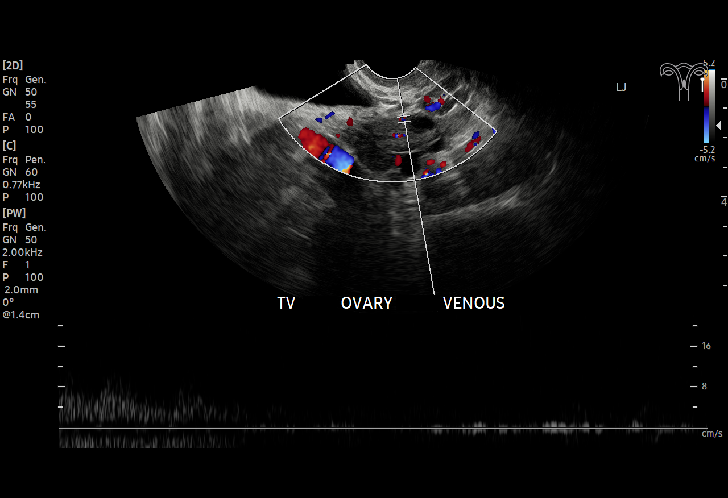

[15 of 25 positions shown; findings below may reference images not displayed]

FINDINGS: Uterus

Measurements: 8.3 x 4.1 x 5.7 cm = volume: 103.2 mL. No fibroids or
other mass visualized.

Endometrium

Thickness: 8.0 mm.  No focal abnormality visualized.

Right ovary

Measurements: 3.0 x 2.2 x 2.1 cm = volume: 7.2 mL. Normal
appearance/no adnexal mass.

Left ovary

Measurements: 3.4 x 1.7 x 2.7 cm = volume: 8.2 mL. Normal
appearance/no adnexal mass.

Pulsed Doppler evaluation of both ovaries demonstrates normal
low-resistance arterial and venous waveforms.

Other findings

Trace physiologic fluid in the cul-de-sac.
IMPRESSION: 1. The uterus, endometrium, and ovaries are unremarkable. No cause
for the patient's pain identified. Minimal fluid in the cul-de-sac
is likely physiologic.

## 2023-08-28 DIAGNOSIS — L858 Other specified epidermal thickening: Secondary | ICD-10-CM | POA: Diagnosis not present

## 2023-08-28 DIAGNOSIS — D2239 Melanocytic nevi of other parts of face: Secondary | ICD-10-CM | POA: Diagnosis not present

## 2023-09-17 DIAGNOSIS — F900 Attention-deficit hyperactivity disorder, predominantly inattentive type: Secondary | ICD-10-CM | POA: Diagnosis not present

## 2023-09-17 DIAGNOSIS — F419 Anxiety disorder, unspecified: Secondary | ICD-10-CM | POA: Diagnosis not present

## 2023-09-27 ENCOUNTER — Encounter: Payer: Self-pay | Admitting: Family

## 2023-10-03 ENCOUNTER — Encounter: Payer: BC Managed Care – PPO | Admitting: Nurse Practitioner

## 2023-10-09 DIAGNOSIS — N760 Acute vaginitis: Secondary | ICD-10-CM | POA: Diagnosis not present

## 2023-10-26 MED ORDER — DEXLANSOPRAZOLE 60 MG PO CPDR: 60.0000 mg | DELAYED_RELEASE_CAPSULE | Freq: Every day | ORAL | 3 refills | Status: AC

## 2023-10-26 NOTE — Telephone Encounter (Signed)
Jocelyn Sawyer see note from the pt.

## 2023-10-26 NOTE — Addendum Note (Signed)
 Addended by: Aneita Keens on: 10/26/2023 09:30 AM   Modules accepted: Orders

## 2023-11-06 ENCOUNTER — Ambulatory Visit (INDEPENDENT_AMBULATORY_CARE_PROVIDER_SITE_OTHER): Admitting: Nurse Practitioner

## 2023-11-06 ENCOUNTER — Encounter: Payer: Self-pay | Admitting: Nurse Practitioner

## 2023-11-06 VITALS — BP 114/72 | HR 75 | Temp 97.9°F | Ht 63.0 in | Wt 127.0 lb

## 2023-11-06 DIAGNOSIS — Z0000 Encounter for general adult medical examination without abnormal findings: Secondary | ICD-10-CM | POA: Insufficient documentation

## 2023-11-06 DIAGNOSIS — E559 Vitamin D deficiency, unspecified: Secondary | ICD-10-CM | POA: Diagnosis not present

## 2023-11-06 DIAGNOSIS — F32A Depression, unspecified: Secondary | ICD-10-CM

## 2023-11-06 DIAGNOSIS — E611 Iron deficiency: Secondary | ICD-10-CM

## 2023-11-06 DIAGNOSIS — M6289 Other specified disorders of muscle: Secondary | ICD-10-CM

## 2023-11-06 DIAGNOSIS — J45909 Unspecified asthma, uncomplicated: Secondary | ICD-10-CM

## 2023-11-06 DIAGNOSIS — F419 Anxiety disorder, unspecified: Secondary | ICD-10-CM

## 2023-11-06 DIAGNOSIS — F902 Attention-deficit hyperactivity disorder, combined type: Secondary | ICD-10-CM | POA: Diagnosis not present

## 2023-11-06 DIAGNOSIS — K219 Gastro-esophageal reflux disease without esophagitis: Secondary | ICD-10-CM

## 2023-11-06 LAB — CBC WITH DIFFERENTIAL/PLATELET
Basophils Absolute: 0 10*3/uL (ref 0.0–0.1)
Basophils Relative: 0.3 % (ref 0.0–3.0)
Eosinophils Absolute: 0.1 10*3/uL (ref 0.0–0.7)
Eosinophils Relative: 1.3 % (ref 0.0–5.0)
HCT: 41.7 % (ref 36.0–46.0)
Hemoglobin: 14 g/dL (ref 12.0–15.0)
Lymphocytes Relative: 24.9 % (ref 12.0–46.0)
Lymphs Abs: 2.3 10*3/uL (ref 0.7–4.0)
MCHC: 33.6 g/dL (ref 30.0–36.0)
MCV: 86.2 fl (ref 78.0–100.0)
Monocytes Absolute: 0.5 10*3/uL (ref 0.1–1.0)
Monocytes Relative: 5.8 % (ref 3.0–12.0)
Neutro Abs: 6.3 10*3/uL (ref 1.4–7.7)
Neutrophils Relative %: 67.7 % (ref 43.0–77.0)
Platelets: 310 10*3/uL (ref 150.0–400.0)
RBC: 4.84 Mil/uL (ref 3.87–5.11)
RDW: 13.3 % (ref 11.5–15.5)
WBC: 9.4 10*3/uL (ref 4.0–10.5)

## 2023-11-06 LAB — COMPREHENSIVE METABOLIC PANEL WITH GFR
ALT: 22 U/L (ref 0–35)
AST: 20 U/L (ref 0–37)
Albumin: 4.4 g/dL (ref 3.5–5.2)
Alkaline Phosphatase: 55 U/L (ref 39–117)
BUN: 23 mg/dL (ref 6–23)
CO2: 27 meq/L (ref 19–32)
Calcium: 9.6 mg/dL (ref 8.4–10.5)
Chloride: 100 meq/L (ref 96–112)
Creatinine, Ser: 1.03 mg/dL (ref 0.40–1.20)
GFR: 68.88 mL/min (ref >60.00)
Glucose, Bld: 85 mg/dL (ref 70–99)
Potassium: 3.8 meq/L (ref 3.5–5.1)
Sodium: 136 meq/L (ref 135–145)
Total Bilirubin: 0.6 mg/dL (ref 0.2–1.2)
Total Protein: 7.2 g/dL (ref 6.0–8.3)

## 2023-11-06 LAB — FERRITIN: Ferritin: 14.6 ng/mL (ref 10.0–291.0)

## 2023-11-06 LAB — MAGNESIUM: Magnesium: 2.1 mg/dL (ref 1.5–2.5)

## 2023-11-06 LAB — TSH: TSH: 1.91 u[IU]/mL (ref 0.35–5.50)

## 2023-11-06 LAB — IRON: Iron: 88 ug/dL (ref 42–145)

## 2023-11-06 LAB — VITAMIN D 25 HYDROXY (VIT D DEFICIENCY, FRACTURES): VITD: 33.79 ng/mL (ref 30.00–100.00)

## 2023-11-06 MED ORDER — ALBUTEROL SULFATE HFA 108 (90 BASE) MCG/ACT IN AERS
1.0000 | INHALATION_SPRAY | Freq: Four times a day (QID) | RESPIRATORY_TRACT | 2 refills | Status: AC | PRN
Start: 2023-11-06 — End: ?

## 2023-11-06 NOTE — Assessment & Plan Note (Signed)
 Her adult-onset asthma is managed with albuterol  as needed. Refill the albuterol  inhaler prescription.

## 2023-11-06 NOTE — Assessment & Plan Note (Signed)
 Chronic GERD is managed with Dexilant  60mg  daily. A previous attempt to reduce the dosage was unsuccessful, so continue Dexilant  as prescribed. She is following with GI. Check magnesium today.

## 2023-11-06 NOTE — Assessment & Plan Note (Signed)
 Iron deficiency anemia is likely exacerbated by heavy menstrual periods, causing fatigue, especially post-menstruation. Check CBC, iron, and ferritin and continue taking iron supplements every other day.

## 2023-11-06 NOTE — Assessment & Plan Note (Signed)
 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 Continue stress management techniques such as meditation and relaxation exercises.

## 2023-11-06 NOTE — Assessment & Plan Note (Signed)
Check vitamin D and treat based on results.  ?

## 2023-11-06 NOTE — Assessment & Plan Note (Signed)
Health maintenance reviewed and updated. Discussed nutrition, exercise. Follow-up 1 year.

## 2023-11-06 NOTE — Assessment & Plan Note (Signed)
 Chronic, stable. She manages her mood with exercise, meditation, deep breathing, and a magnesium supplement. Check TSH today. Follow-up with any concerns.

## 2023-11-06 NOTE — Progress Notes (Signed)
 New Patient Visit  BP 114/72 (BP Location: Left Arm, Patient Position: Sitting, Cuff Size: Normal)   Pulse 75   Temp 97.9 F (36.6 C)   Ht 5\' 3"  (1.6 m)   Wt 127 lb (57.6 kg)   LMP 10/26/2023 (Exact Date)   SpO2 96%   BMI 22.50 kg/m    Subjective:    Patient ID: Jocelyn Sawyer, female    DOB: 03-02-1986, 38 y.o.   MRN: [id number]  CC: Chief Complaint  Patient presents with   Transfer of Care    NP. Est. Care, Rx Refill, no concerns    HPI: Jocelyn Sawyer is a 38 y.o. female presents for new patient visit to establish care.  Introduced to Publishing rights manager role and practice setting.  All questions answered.  Discussed provider/patient relationship and expectations.  Discussed the use of AI scribe software for clinical note transcription with the patient, who gave verbal consent to proceed.  History of Present Illness   Jocelyn Sawyer is a 38 year old female who presents for establishing care and routine lab work.  She has asthma, primarily triggered by illness or allergies, causing a persistent cough and dyspnea. She uses albuterol  as needed and requires a refill. She takes Adderall 20 mg daily for ADHD, Dexilant  for acid reflux, and iron supplements every other day for iron deficiency. Her heavy menstrual periods with significant cramping may contribute to her iron deficiency. She has had three C-sections and a tubal ligation, after which her menstrual symptoms worsened. She does not use hormonal birth control due to past difficulties in finding a suitable option.  Her family history includes ADHD, depression, hypertension, kidney disease, and bipolar disorder in her mother, and various cancers in her father and grandparents. She does not consume alcohol regularly due to stomach sensitivity and does not smoke or vape. She exercises regularly for mental health benefits.  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 No current issues with dizziness,  headaches, chest pain, or shortness of breath.        11/06/2023   10:30 AM 03/09/2021    2:36 PM 02/10/2021   10:21 AM 12/02/2020    2:48 PM 09/17/2020    3:25 PM  Depression screen PHQ 2/9  Decreased Interest 0 0 0 0 1  Down, Depressed, Hopeless 0 0 1 0 2  PHQ - 2 Score 0 0 1 0 3  Altered sleeping 0  0 0 0  Tired, decreased energy 0  0 3 2  Change in appetite 0  0 0 0  Feeling bad or failure about yourself  0  0  2  Trouble concentrating 0  0 0 2  Moving slowly or fidgety/restless 0  0 0 0  Suicidal thoughts 0  0 0 0  PHQ-9 Score 0  1 3 9   Difficult doing work/chores Not difficult at all    Very difficult      11/06/2023   10:30 AM 02/10/2021   10:21 AM 12/02/2020    2:48 PM 09/02/2020    2:19 PM  GAD 7 : Generalized Anxiety Score  Nervous, Anxious, on Edge 0 1 1 2   Control/stop worrying 0 0 0 1  Worry too much - different things 0 0 0 0  Trouble relaxing 0 0 0 0  Restless 0 0 0 0  Easily annoyed or irritable 0 0 0 0  Afraid - awful might happen 0 0 0 0  Total GAD 7 Score 0  1 1 3   Anxiety Difficulty Not difficult at all       Past Medical History:  Diagnosis Date   ADHD (attention deficit hyperactivity disorder)    Allergy 1987   Sulfa drugs   Asthma 2006   Essential hypertension in pregnancy 08/26/2015   GERD (gastroesophageal reflux disease)    IDA (iron deficiency anemia)     Past Surgical History:  Procedure Laterality Date   CESAREAN SECTION  2013, 2017, 2021   x 3   TONSILLECTOMY     TUBAL LIGATION  11/05/2019    Family History  Problem Relation Age of Onset   Hypertension Mother    Bipolar disorder Mother    Kidney disease Mother    ADD / ADHD Mother    Depression Mother    Throat cancer Father        squamous cell   Cancer Father        throat   Breast cancer Maternal Grandmother    Leukemia Maternal Grandmother    Cancer Maternal Grandmother        bone   Heart attack Maternal Grandfather    Bone cancer Paternal Grandmother    Cancer Paternal  Grandmother        breast, leukemia   Suicidality Paternal Grandfather    Colon polyps Neg Hx    Esophageal cancer Neg Hx    Pancreatic cancer Neg Hx    Stomach cancer Neg Hx      Social History   Tobacco Use   Smoking status: Never   Smokeless tobacco: Never  Vaping Use   Vaping status: Never Used  Substance Use Topics   Alcohol use: Not Currently   Drug use: Never    Current Outpatient Medications on File Prior to Visit  Medication Sig Dispense Refill   amphetamine-dextroamphetamine (ADDERALL XR) 10 MG 24 hr capsule Take 20 mg by mouth daily.     dexlansoprazole  (DEXILANT ) 60 MG capsule Take 1 capsule (60 mg total) by mouth daily. 90 capsule 3   Ferrous Sulfate (IRON PO) Take by mouth every other day.     MAGNESIUM PO Take by mouth at bedtime.     Multiple Vitamins-Minerals (ZINC PO) Take by mouth daily.     Probiotic Product (PROBIOTIC-10 PO) Take by mouth.     tretinoin (RETIN-A) 0.025 % cream at bedtime.     No current facility-administered medications on file prior to visit.     Review of Systems  Constitutional:  Positive for fatigue. Negative for fever.  HENT: Negative.    Eyes: Negative.   Respiratory: Negative.    Cardiovascular: Negative.   Gastrointestinal: Negative.   Endocrine: Negative.   Genitourinary: Negative.   Musculoskeletal: Negative.   Skin: Negative.   Neurological: Negative.   Psychiatric/Behavioral: Negative.          Objective:    BP 114/72 (BP Location: Left Arm, Patient Position: Sitting, Cuff Size: Normal)   Pulse 75   Temp 97.9 F (36.6 C)   Ht 5\' 3"  (1.6 m)   Wt 127 lb (57.6 kg)   LMP 10/26/2023 (Exact Date)   SpO2 96%   BMI 22.50 kg/m   Wt Readings from Last 3 Encounters:  11/06/23 127 lb (57.6 kg)  04/25/23 131 lb 6 oz (59.6 kg)  07/26/21 130 lb (59 kg)    BP Readings from Last 3 Encounters:  11/06/23 114/72  04/25/23 126/70  07/26/21 104/68    Physical Exam Vitals and nursing note reviewed.  Constitutional:       General: She is not in acute distress.    Appearance: Normal appearance.  HENT:     Head: Normocephalic and atraumatic.     Right Ear: Tympanic membrane, ear canal and external ear normal.     Left Ear: Tympanic membrane, ear canal and external ear normal.     Mouth/Throat:     Mouth: Mucous membranes are moist.     Pharynx: No posterior oropharyngeal erythema.  Eyes:     Conjunctiva/sclera: Conjunctivae normal.  Cardiovascular:     Rate and Rhythm: Normal rate and regular rhythm.     Pulses: Normal pulses.     Heart sounds: Normal heart sounds.  Pulmonary:     Effort: Pulmonary effort is normal.     Breath sounds: Normal breath sounds.  Abdominal:     Palpations: Abdomen is soft.     Tenderness: There is no abdominal tenderness.  Musculoskeletal:        General: Normal range of motion.     Cervical back: Normal range of motion and neck supple.     Right lower leg: No edema.     Left lower leg: No edema.  Lymphadenopathy:     Cervical: No cervical adenopathy.  Skin:    General: Skin is warm and dry.  Neurological:     General: No focal deficit present.     Mental Status: She is alert and oriented to person, place, and time.     Cranial Nerves: No cranial nerve deficit.     Coordination: Coordination normal.     Gait: Gait normal.  Psychiatric:        Mood and Affect: Mood normal.        Behavior: Behavior normal.        Thought Content: Thought content normal.        Judgment: Judgment normal.       Assessment & Plan:   Problem List Items Addressed This Visit       Respiratory   Asthma   Her adult-onset asthma is managed with albuterol  as needed. Refill the albuterol  inhaler prescription.      Relevant Medications   albuterol  (VENTOLIN  HFA) 108 (90 Base) MCG/ACT inhaler     Digestive   Gastroesophageal reflux disease   Chronic GERD is managed with Dexilant  60mg  daily. A previous attempt to reduce the dosage was unsuccessful, so continue Dexilant  as  prescribed. She is following with GI. Check magnesium today.       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 Continue stress management techniques such as meditation and relaxation exercises.        Other   ADHD (attention deficit hyperactivity disorder)   ADHD is managed with Adderall 20 mg daily. She is following with psychiatry for this.       Iron deficiency   Iron deficiency anemia is likely exacerbated by heavy menstrual periods, causing fatigue, especially post-menstruation. Check CBC, iron, and ferritin and continue taking iron supplements every other day.      Relevant Orders   CBC with Differential/Platelet   Comprehensive metabolic panel with GFR   Ferritin   Iron   Anxiety and depression   Chronic, stable. She manages her mood with exercise, meditation, deep breathing, and a magnesium supplement. Check TSH today. Follow-up with any concerns.  Relevant Orders   TSH   Vitamin D insufficiency   Check vitamin D and treat based on results.       Relevant Orders   VITAMIN D 25 Hydroxy (Vit-D Deficiency, Fractures)   Routine general medical examination at a health care facility - Primary   Health maintenance reviewed and updated. Discussed nutrition, exercise. Follow-up 1 year.         LABORATORY TESTING:  - Pap smear: up to date  IMMUNIZATIONS:   - Tdap: Tetanus vaccination status reviewed: last tetanus booster within 10 years. - Influenza: Postponed to flu season - Pneumovax: Not applicable - Prevnar: Not applicable - HPV: Not applicable - Shingrix vaccine: Not applicable  SCREENING: -Mammogram: Not applicable  - Colonoscopy: Not applicable  - Bone Density: Not applicable   PATIENT COUNSELING:   Advised to take 1 mg of folate supplement per day if capable of pregnancy.   Sexuality: Discussed sexually  transmitted diseases, partner selection, use of condoms, avoidance of unintended pregnancy  and contraceptive alternatives.   Advised to avoid cigarette smoking.  I discussed with the patient that most people either abstain from alcohol or drink within safe limits (<=14/week and <=4 drinks/occasion for males, <=7/weeks and <= 3 drinks/occasion for females) and that the risk for alcohol disorders and other health effects rises proportionally with the number of drinks per week and how often a drinker exceeds daily limits.  Discussed cessation/primary prevention of drug use and availability of treatment for abuse.   Diet: Encouraged to adjust caloric intake to maintain  or achieve ideal body weight, to reduce intake of dietary saturated fat and total fat, to limit sodium intake by avoiding high sodium foods and not adding table salt, and to maintain adequate dietary potassium and calcium preferably from fresh fruits, vegetables, and low-fat dairy products.    stressed the importance of regular exercise  Injury prevention: Discussed safety belts, safety helmets, smoke detector, smoking near bedding or upholstery.   Dental health: Discussed importance of regular tooth brushing, flossing, and dental visits.    NEXT PREVENTATIVE PHYSICAL DUE IN 1 YEAR.   Follow up plan: Return in about 1 year (around 11/05/2024) for CPE.  Tadashi Burkel A Jocelyn Sawyer

## 2023-11-06 NOTE — Patient Instructions (Signed)
 It was great to see you!  We are checking your labs today and will let you know the results via mychart/phone.   I have refilled your albuterol  inhaler  Let's follow-up in 1 year, sooner if you have concerns.  If a referral was placed today, you will be contacted for an appointment. Please note that routine referrals can sometimes take up to 3-4 weeks to process. Please call our office if you haven't heard anything after this time frame.  Take care,  Rheba Cedar, NP

## 2023-11-06 NOTE — Assessment & Plan Note (Signed)
 ADHD is managed with Adderall 20 mg daily. She is following with psychiatry for this.

## 2023-11-07 ENCOUNTER — Encounter: Payer: Self-pay | Admitting: Nurse Practitioner

## 2023-12-17 DIAGNOSIS — F900 Attention-deficit hyperactivity disorder, predominantly inattentive type: Secondary | ICD-10-CM | POA: Diagnosis not present

## 2023-12-17 DIAGNOSIS — F419 Anxiety disorder, unspecified: Secondary | ICD-10-CM | POA: Diagnosis not present

## 2024-01-18 ENCOUNTER — Telehealth: Payer: Self-pay

## 2024-01-18 ENCOUNTER — Other Ambulatory Visit (HOSPITAL_COMMUNITY): Payer: Self-pay

## 2024-01-18 NOTE — Telephone Encounter (Signed)
 Pt notified via my chart message. She does check her messages.

## 2024-01-18 NOTE — Telephone Encounter (Signed)
 Pharmacy Patient Advocate Encounter   Received notification from CoverMyMeds that prior authorization for Dexlansoprazole  60MG  dr capsules is required/requested.   Insurance verification completed.   The patient is insured through [employer] .   Prior Authorization for Dexlansoprazole  60MG  dr capsules has been APPROVED from 12-19-2023 to 01-17-2025   PA #/Case ID/Reference #: [id number]

## 2024-03-17 DIAGNOSIS — F419 Anxiety disorder, unspecified: Secondary | ICD-10-CM | POA: Diagnosis not present

## 2024-03-17 DIAGNOSIS — F900 Attention-deficit hyperactivity disorder, predominantly inattentive type: Secondary | ICD-10-CM | POA: Diagnosis not present

## 2024-05-13 DIAGNOSIS — F419 Anxiety disorder, unspecified: Secondary | ICD-10-CM | POA: Diagnosis not present

## 2024-05-13 DIAGNOSIS — F900 Attention-deficit hyperactivity disorder, predominantly inattentive type: Secondary | ICD-10-CM | POA: Diagnosis not present

## 2024-11-07 ENCOUNTER — Encounter: Admitting: Nurse Practitioner
